# Patient Record
Sex: Male | Born: 1935 | Race: White | Hispanic: No | Smoking: Former smoker
Health system: Southern US, Community
[De-identification: ages and names within clinical notes are randomized; demographics above are authoritative.]

## PROBLEM LIST (undated history)

## (undated) DIAGNOSIS — I219 Acute myocardial infarction, unspecified: Secondary | ICD-10-CM

## (undated) DIAGNOSIS — E039 Hypothyroidism, unspecified: Secondary | ICD-10-CM

## (undated) DIAGNOSIS — D509 Iron deficiency anemia, unspecified: Secondary | ICD-10-CM

## (undated) DIAGNOSIS — N289 Disorder of kidney and ureter, unspecified: Secondary | ICD-10-CM

## (undated) DIAGNOSIS — I779 Disorder of arteries and arterioles, unspecified: Secondary | ICD-10-CM

## (undated) DIAGNOSIS — I2510 Atherosclerotic heart disease of native coronary artery without angina pectoris: Secondary | ICD-10-CM

## (undated) DIAGNOSIS — I739 Peripheral vascular disease, unspecified: Secondary | ICD-10-CM

## (undated) DIAGNOSIS — C4490 Unspecified malignant neoplasm of skin, unspecified: Secondary | ICD-10-CM

## (undated) DIAGNOSIS — H547 Unspecified visual loss: Secondary | ICD-10-CM

## (undated) DIAGNOSIS — I10 Essential (primary) hypertension: Secondary | ICD-10-CM

## (undated) DIAGNOSIS — F0390 Unspecified dementia without behavioral disturbance: Secondary | ICD-10-CM

## (undated) HISTORY — DX: Peripheral vascular disease, unspecified: I73.9

## (undated) HISTORY — DX: Disorder of arteries and arterioles, unspecified: I77.9

## (undated) HISTORY — DX: Unspecified dementia, unspecified severity, without behavioral disturbance, psychotic disturbance, mood disturbance, and anxiety: F03.90

## (undated) HISTORY — DX: Unspecified visual loss: H54.7

## (undated) HISTORY — DX: Hypothyroidism, unspecified: E03.9

## (undated) HISTORY — PX: REPLACEMENT TOTAL KNEE: SUR1224

## (undated) HISTORY — PX: OTHER SURGICAL HISTORY: SHX169

## (undated) HISTORY — PX: VASECTOMY: SHX75

## (undated) HISTORY — DX: Iron deficiency anemia, unspecified: D50.9

## (undated) HISTORY — DX: Unspecified malignant neoplasm of skin, unspecified: C44.90

## (undated) HISTORY — DX: Atherosclerotic heart disease of native coronary artery without angina pectoris: I25.10

---

## 1993-12-26 HISTORY — PX: OTHER SURGICAL HISTORY: SHX169

## 2003-12-27 HISTORY — PX: CORONARY/GRAFT ACUTE MI REVASCULARIZATION: CATH118305

## 2004-12-26 HISTORY — PX: HERNIA REPAIR: SHX51

## 2007-12-27 HISTORY — PX: NASAL SEPTUM SURGERY: SHX37

## 2010-12-26 HISTORY — PX: OTHER SURGICAL HISTORY: SHX169

## 2011-12-27 DIAGNOSIS — I219 Acute myocardial infarction, unspecified: Secondary | ICD-10-CM

## 2011-12-27 HISTORY — DX: Acute myocardial infarction, unspecified: I21.9

## 2017-06-04 ENCOUNTER — Emergency Department (HOSPITAL_COMMUNITY): Payer: Medicare Other

## 2017-06-04 ENCOUNTER — Emergency Department (HOSPITAL_COMMUNITY)
Admission: EM | Admit: 2017-06-04 | Discharge: 2017-06-04 | Disposition: A | Discharge status: Home / Self Care | Payer: Medicare Other | Attending: Emergency Medicine | Admitting: Emergency Medicine

## 2017-06-04 ENCOUNTER — Encounter (HOSPITAL_COMMUNITY): Payer: Self-pay | Admitting: Emergency Medicine

## 2017-06-04 DIAGNOSIS — Z96653 Presence of artificial knee joint, bilateral: Secondary | ICD-10-CM | POA: Diagnosis not present

## 2017-06-04 DIAGNOSIS — I129 Hypertensive chronic kidney disease with stage 1 through stage 4 chronic kidney disease, or unspecified chronic kidney disease: Secondary | ICD-10-CM | POA: Diagnosis not present

## 2017-06-04 DIAGNOSIS — Y92009 Unspecified place in unspecified non-institutional (private) residence as the place of occurrence of the external cause: Secondary | ICD-10-CM | POA: Insufficient documentation

## 2017-06-04 DIAGNOSIS — Z87891 Personal history of nicotine dependence: Secondary | ICD-10-CM | POA: Insufficient documentation

## 2017-06-04 DIAGNOSIS — N183 Chronic kidney disease, stage 3 (moderate): Secondary | ICD-10-CM | POA: Diagnosis not present

## 2017-06-04 DIAGNOSIS — Y999 Unspecified external cause status: Secondary | ICD-10-CM | POA: Diagnosis not present

## 2017-06-04 DIAGNOSIS — W19XXXA Unspecified fall, initial encounter: Secondary | ICD-10-CM

## 2017-06-04 DIAGNOSIS — Y9301 Activity, walking, marching and hiking: Secondary | ICD-10-CM | POA: Insufficient documentation

## 2017-06-04 DIAGNOSIS — W108XXA Fall (on) (from) other stairs and steps, initial encounter: Secondary | ICD-10-CM | POA: Diagnosis not present

## 2017-06-04 DIAGNOSIS — S0990XA Unspecified injury of head, initial encounter: Secondary | ICD-10-CM | POA: Diagnosis present

## 2017-06-04 DIAGNOSIS — S0181XA Laceration without foreign body of other part of head, initial encounter: Secondary | ICD-10-CM | POA: Diagnosis not present

## 2017-06-04 DIAGNOSIS — T148XXA Other injury of unspecified body region, initial encounter: Secondary | ICD-10-CM

## 2017-06-04 HISTORY — DX: Acute myocardial infarction, unspecified: I21.9

## 2017-06-04 HISTORY — DX: Disorder of kidney and ureter, unspecified: N28.9

## 2017-06-04 HISTORY — DX: Essential (primary) hypertension: I10

## 2017-06-04 NOTE — ED Notes (Signed)
Patient transported to CT 

## 2017-06-04 NOTE — ED Triage Notes (Signed)
Per EMS, pt was walking across the parking lot, tried to step on to the curb, tripped and had a mechanical fall forward onto the grass. Pt has abrasions over both hands and knuckles, a laceration to his lip, and skin tear to left face under eye. Unsure of LOC. Pt c/o of bilateral shoulder pain, lip and face pain. No deformities noted. Pt A&O x4. Pt reports he has had on and off blurred/double vision when walking, unknown reason. Pt's left eye stats at 6mm. Pt has history of dementia and takes Aricept. Pt also takes 2 baby ASA/day.

## 2017-06-04 NOTE — ED Notes (Signed)
Patient to X-ray

## 2017-06-04 NOTE — ED Provider Notes (Signed)
[facility] DEPT Provider Note   CSN: [id number] Arrival date & time: 06/04/17  1649     History   Chief Complaint Chief Complaint  Patient presents with  . Fall    HPI Dayan Desa is a 83 y.o. male.  HPI  Patient 83 year old male past medical history of hypertension, CAD, who presents to the emergency department following a mechanical fall. States that he tripped when walking up the steps to get into his house. Landed on his face. Denies loss of consciousness. Complaining of pain to his face, right shoulder, left hand. Denies preceding chest pain, shortness of breath, nausea, vomiting. Endorses intermittent diplopia that has been ongoing for the past couple of months, currently being worked up as an outpatient with ophthalmology. Denies anticoagulation, does take a baby aspirin daily. Denies chest pain, back pain, abdominal pain, nausea, vomiting. Patient has not ambulated following the fall.  Past Medical History:  Diagnosis Date  . Hypertension   . MI (myocardial infarction) ([place]) 2013  . Renal disorder    stage 3 kidney failure    There are no active problems to display for this patient.   Past Surgical History:  Procedure Laterality Date  . REPLACEMENT TOTAL KNEE Left        Home Medications    Prior to Admission medications   Not on File    Family History No family history on file.  Social History Social History  Substance Use Topics  . Smoking status: Former Smoker    Years: 20.00    Types: Cigarettes    Quit date: 1963  . Smokeless tobacco: Never Used  . Alcohol use Yes     Comment: moderate     Allergies   Patient has no allergy information on record.   Review of Systems Review of Systems  Constitutional: Negative for chills and fever.  HENT: Negative for dental problem.   Eyes: Negative for pain and visual disturbance.  Respiratory: Negative for chest tightness and shortness of breath.   Cardiovascular: Negative for chest pain and  palpitations.  Gastrointestinal: Negative for abdominal pain, blood in stool, nausea and vomiting.  Genitourinary: Negative for decreased urine volume, dysuria and flank pain.  Musculoskeletal: Negative for back pain.  Skin: Positive for wound.  Neurological: Negative for dizziness, seizures, syncope, weakness and headaches.  Psychiatric/Behavioral: Negative for behavioral problems.     Physical Exam Updated Vital Signs BP (!) 155/56   Pulse (!) 55   Temp 98.4 F (36.9 C) (Oral)   Resp 18   Ht 5\' 9"  (1.753 m)   Wt 63.5 kg (140 lb)   SpO2 100%   BMI 20.67 kg/m   Physical Exam  Constitutional: He is oriented to person, place, and time. He appears well-developed and well-nourished. No distress.  HENT:  Skin tear to the left inferior eye to the left cheek. Hemostatic. Lower lip with ecchymosis and swelling. 0.5 cm laceration to inner lip. No nasal deviation, no septal hematoma.  Eyes: Conjunctivae and EOM are normal. Pupils are equal, round, and reactive to light.  Sensation intact in V2 distribution.  Neck: Normal range of motion.  No midline cervical, thoracic, lumbar tenderness to palpation.  Cardiovascular: Normal rate, regular rhythm, normal heart sounds and intact distal pulses.   No murmur heard. Pulmonary/Chest: Effort normal and breath sounds normal. No respiratory distress. He exhibits no tenderness.  Abdominal: Soft. He exhibits no distension and no mass. There is no tenderness. There is no guarding.  Musculoskeletal: Normal range of  motion.  No tenderness or immobility to pelvis compression.  Neurological: He is alert and oriented to person, place, and time. He displays normal reflexes. No cranial nerve deficit. Coordination normal.  Skin: Skin is warm.  Psychiatric: He has a normal mood and affect.     ED Treatments / Results  Labs (all labs ordered are listed, but only abnormal results are displayed) Labs Reviewed - No data to display  EKG  EKG  Interpretation None       Radiology Dg Chest 2 View  Result Date: 06/04/2017 CLINICAL DATA:  Fall EXAM: CHEST  2 VIEW COMPARISON:  None. FINDINGS: The heart size and mediastinal contours are within normal limits. Both lungs are clear. The visualized skeletal structures are unremarkable. IMPRESSION: No active cardiopulmonary disease. Electronically Signed   By: Ulyses Jarred M.D.   On: 06/04/2017 18:36   Dg Pelvis 1-2 Views  Result Date: 06/04/2017 CLINICAL DATA:  Generalized right hip pain after a fall. EXAM: PELVIS - 1-2 VIEW COMPARISON:  None. FINDINGS: Femoral heads are located. Sacroiliac joints are symmetric. No acute fracture. Advanced lower lumbar spondylosis. IMPRESSION: No acute osseous abnormality. Electronically Signed   By: Abigail Miyamoto M.D.   On: 06/04/2017 18:37   Dg Shoulder Right  Result Date: 06/04/2017 CLINICAL DATA:  Mechanical fall with generalized right shoulder pain EXAM: RIGHT SHOULDER - 2+ VIEW COMPARISON:  None. FINDINGS: No fracture or dislocation. Mild glenohumeral degenerative changes. Mild AC joint degenerative changes. IMPRESSION: No acute osseous abnormality Electronically Signed   By: Donavan Foil M.D.   On: 06/04/2017 18:38   Ct Head Wo Contrast  Result Date: 06/04/2017 CLINICAL DATA:  Fall with facial bruising EXAM: CT HEAD WITHOUT CONTRAST CT MAXILLOFACIAL WITHOUT CONTRAST CT CERVICAL SPINE WITHOUT CONTRAST TECHNIQUE: Multidetector CT imaging of the head, cervical spine, and maxillofacial structures were performed using the standard protocol without intravenous contrast. Multiplanar CT image reconstructions of the cervical spine and maxillofacial structures were also generated. COMPARISON:  None. FINDINGS: CT HEAD FINDINGS Brain: No mass lesion, intraparenchymal hemorrhage or extra-axial collection. No evidence of acute cortical infarct. There is periventricular hypoattenuation compatible with chronic microvascular disease. There is a single focus of  hyperattenuation along the lateral margin of the right lateral ventricle atrium, favored to be a prominent vein or calcification. Vascular: Atherosclerotic calcification of the vertebral and internal carotid arteries at the skull base. CT MAXILLOFACIAL FINDINGS Osseous: --Complex facial fracture types: No LeFort, zygomaticomaxillary complex or nasoorbitoethmoidal fracture. --Simple fracture types: There is an age indeterminate fracture of the left lamina papyracea. There is no intraorbital gas. --Mandible: No fracture or dislocation. 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 Orbits: The globes appear intact. Normal appearance of the intra- and extraconal fat. Symmetric extraocular muscles. Sinuses: No fluid levels or advanced mucosal thickening. Soft tissues: Mild left periorbital soft tissue swelling. CT CERVICAL SPINE FINDINGS Alignment: No static subluxation. Facets are aligned. Occipital condyles are normally positioned. Skull base and vertebrae: No acute fracture. Soft tissues and spinal canal: No prevertebral fluid or swelling. No visible canal hematoma. Disc levels: Right-greater-than-left uncovertebral hypertrophy and facet arthrosis contribute to moderate to severe neural foraminal narrowing at right C3-4, right C4-5, right C5-6 is and bilateral C6-7. There is severe spinal canal stenosis at the C5-6 level secondary to a large posterior osteophyte. There are flowing anterior osteophytes at C3-C7. Upper chest: No pneumothorax, pulmonary nodule or pleural effusion. Other: Normal visualized paraspinal cervical soft tissues. IMPRESSION: 1. No acute intracranial abnormality. 2. No acute  fracture or static subluxation of the cervical spine. 3. Age-indeterminate fracture of the left lamina papyracea. Given the lack of intraorbital gas, this may be chronic. No other facial fracture. Left periorbital soft tissue swelling. 4. Severe spinal canal stenosis at C5-C6 and multilevel moderate to  severe neural foraminal stenosis, worse on the right. Electronically Signed   By: Ulyses Jarred M.D.   On: 06/04/2017 19:31   Ct Cervical Spine Wo Contrast  Result Date: 06/04/2017 CLINICAL DATA:  Fall with facial bruising EXAM: CT HEAD WITHOUT CONTRAST CT MAXILLOFACIAL WITHOUT CONTRAST CT CERVICAL SPINE WITHOUT CONTRAST TECHNIQUE: Multidetector CT imaging of the head, cervical spine, and maxillofacial structures were performed using the standard protocol without intravenous contrast. Multiplanar CT image reconstructions of the cervical spine and maxillofacial structures were also generated. COMPARISON:  None. FINDINGS: CT HEAD FINDINGS Brain: No mass lesion, intraparenchymal hemorrhage or extra-axial collection. No evidence of acute cortical infarct. There is periventricular hypoattenuation compatible with chronic microvascular disease. There is a single focus of hyperattenuation along the lateral margin of the right lateral ventricle atrium, favored to be a prominent vein or calcification. Vascular: Atherosclerotic calcification of the vertebral and internal carotid arteries at the skull base. CT MAXILLOFACIAL FINDINGS Osseous: --Complex facial fracture types: No LeFort, zygomaticomaxillary complex or nasoorbitoethmoidal fracture. --Simple fracture types: There is an age indeterminate fracture of the left lamina papyracea. There is no intraorbital gas. --Mandible: No fracture or dislocation. 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 Orbits: The globes appear intact. Normal appearance of the intra- and extraconal fat. Symmetric extraocular muscles. Sinuses: No fluid levels or advanced mucosal thickening. Soft tissues: Mild left periorbital soft tissue swelling. CT CERVICAL SPINE FINDINGS Alignment: No static subluxation. Facets are aligned. Occipital condyles are normally positioned. Skull base and vertebrae: No acute fracture. Soft tissues and spinal canal: No prevertebral fluid or  swelling. No visible canal hematoma. Disc levels: Right-greater-than-left uncovertebral hypertrophy and facet arthrosis contribute to moderate to severe neural foraminal narrowing at right C3-4, right C4-5, right C5-6 is and bilateral C6-7. There is severe spinal canal stenosis at the C5-6 level secondary to a large posterior osteophyte. There are flowing anterior osteophytes at C3-C7. Upper chest: No pneumothorax, pulmonary nodule or pleural effusion. Other: Normal visualized paraspinal cervical soft tissues. IMPRESSION: 1. No acute intracranial abnormality. 2. No acute fracture or static subluxation of the cervical spine. 3. Age-indeterminate fracture of the left lamina papyracea. Given the lack of intraorbital gas, this may be chronic. No other facial fracture. Left periorbital soft tissue swelling. 4. Severe spinal canal stenosis at C5-C6 and multilevel moderate to severe neural foraminal stenosis, worse on the right. Electronically Signed   By: Ulyses Jarred M.D.   On: 06/04/2017 19:31   Dg Hand Complete Left  Result Date: 06/04/2017 CLINICAL DATA:  Fall with abrasions over the hands, pain at the left ring finger EXAM: LEFT HAND - COMPLETE 3+ VIEW COMPARISON:  None. FINDINGS: No fracture or malalignment. Narrowing and osteophyte formation at the second through fifth DIP joints and the first through fifth DIP joints. No radiopaque foreign body in the soft tissues. IMPRESSION: 1. No acute osseous abnormality 2. DIP and PIP joint arthritis. Electronically Signed   By: Donavan Foil M.D.   On: 06/04/2017 18:45   Ct Maxillofacial Wo Contrast  Result Date: 06/04/2017 CLINICAL DATA:  Fall with facial bruising EXAM: CT HEAD WITHOUT CONTRAST CT MAXILLOFACIAL WITHOUT CONTRAST CT CERVICAL SPINE WITHOUT CONTRAST TECHNIQUE: Multidetector CT imaging of the head, cervical spine, and  maxillofacial structures were performed using the standard protocol without intravenous contrast. Multiplanar CT image reconstructions of  the cervical spine and maxillofacial structures were also generated. COMPARISON:  None. FINDINGS: CT HEAD FINDINGS Brain: No mass lesion, intraparenchymal hemorrhage or extra-axial collection. No evidence of acute cortical infarct. There is periventricular hypoattenuation compatible with chronic microvascular disease. There is a single focus of hyperattenuation along the lateral margin of the right lateral ventricle atrium, favored to be a prominent vein or calcification. Vascular: Atherosclerotic calcification of the vertebral and internal carotid arteries at the skull base. CT MAXILLOFACIAL FINDINGS Osseous: --Complex facial fracture types: No LeFort, zygomaticomaxillary complex or nasoorbitoethmoidal fracture. --Simple fracture types: There is an age indeterminate fracture of the left lamina papyracea. There is no intraorbital gas. --Mandible: No fracture or dislocation. 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 Orbits: The globes appear intact. Normal appearance of the intra- and extraconal fat. Symmetric extraocular muscles. Sinuses: No fluid levels or advanced mucosal thickening. Soft tissues: Mild left periorbital soft tissue swelling. CT CERVICAL SPINE FINDINGS Alignment: No static subluxation. Facets are aligned. Occipital condyles are normally positioned. Skull base and vertebrae: No acute fracture. Soft tissues and spinal canal: No prevertebral fluid or swelling. No visible canal hematoma. Disc levels: Right-greater-than-left uncovertebral hypertrophy and facet arthrosis contribute to moderate to severe neural foraminal narrowing at right C3-4, right C4-5, right C5-6 is and bilateral C6-7. There is severe spinal canal stenosis at the C5-6 level secondary to a large posterior osteophyte. There are flowing anterior osteophytes at C3-C7. Upper chest: No pneumothorax, pulmonary nodule or pleural effusion. Other: Normal visualized paraspinal cervical soft tissues. IMPRESSION: 1. No  acute intracranial abnormality. 2. No acute fracture or static subluxation of the cervical spine. 3. Age-indeterminate fracture of the left lamina papyracea. Given the lack of intraorbital gas, this may be chronic. No other facial fracture. Left periorbital soft tissue swelling. 4. Severe spinal canal stenosis at C5-C6 and multilevel moderate to severe neural foraminal stenosis, worse on the right. Electronically Signed   By: Ulyses Jarred M.D.   On: 06/04/2017 19:31    Procedures Procedures (including critical care time)  Medications Ordered in ED Medications - No data to display   Initial Impression / Assessment and Plan / ED Course  I have reviewed the triage vital signs and the nursing notes.  Pertinent labs & imaging results that were available during my care of the patient were reviewed by me and considered in my medical decision making (see chart for details).    Patient is a 83 year old male who presents to the emergency Department following a mechanical fall. No recurrent falls. Usually walks with a cane, was not using a cane.  No loss of consciousness. ABCs intact. GCS 15. Nonfocal neurologic exam. Benign abdominal exam. No signs of basilar skull fracture.   CT head, C-spine, x-rays obtained. Showed no acute findings. No signs of entrapment. Patient able to ambulate in the room without any difficulties. Do not feel that further imaging is necessary at this time. Patient's skin tear is unable to have repair. Irrigated and dressing applied at bedside.  Patient stable for discharge home. Patient has follow-up with his primary care physician. Given strict return precautions to the emergency department. Patient and his son expressed understanding, questions or concerns at time of discharge.   Final Clinical Impressions(s) / ED Diagnoses   Final diagnoses:  Fall, initial encounter  Abrasion    New Prescriptions There are no discharge medications for this patient.  Nathaniel Man, MD 06/04/17 1914    Varney Biles, MD 06/07/17 [phone]

## 2017-06-22 ENCOUNTER — Telehealth: Payer: Self-pay | Admitting: Physician Assistant

## 2017-06-22 NOTE — Telephone Encounter (Signed)
Received records from [facility] for appointment on 07/05/17 with Almyra Deforest, PA.  Records put with Hao's schedule for 07/05/17. lp

## 2017-07-05 ENCOUNTER — Ambulatory Visit (INDEPENDENT_AMBULATORY_CARE_PROVIDER_SITE_OTHER): Payer: Medicare Other | Admitting: Physician Assistant

## 2017-07-05 ENCOUNTER — Encounter: Payer: Self-pay | Admitting: Physician Assistant

## 2017-07-05 VITALS — BP 134/56 | HR 54 | Wt 137.6 lb

## 2017-07-05 DIAGNOSIS — N183 Chronic kidney disease, stage 3 unspecified: Secondary | ICD-10-CM

## 2017-07-05 DIAGNOSIS — I739 Peripheral vascular disease, unspecified: Secondary | ICD-10-CM

## 2017-07-05 DIAGNOSIS — I779 Disorder of arteries and arterioles, unspecified: Secondary | ICD-10-CM | POA: Insufficient documentation

## 2017-07-05 DIAGNOSIS — I2510 Atherosclerotic heart disease of native coronary artery without angina pectoris: Secondary | ICD-10-CM | POA: Diagnosis not present

## 2017-07-05 DIAGNOSIS — E039 Hypothyroidism, unspecified: Secondary | ICD-10-CM | POA: Diagnosis not present

## 2017-07-05 DIAGNOSIS — I10 Essential (primary) hypertension: Secondary | ICD-10-CM | POA: Diagnosis not present

## 2017-07-05 NOTE — Progress Notes (Signed)
Cardiology Office Note    Date:  07/05/2017   ID:  Curtis [name], DOB [birth year], MRN [id number]  PCP:  Curtis Hatchet, MD  Cardiologist:  Plan to set up with Curtis [name], case discussed. Previously followed by cardiologist in [facility]  Patient was referred by his PCP Curtis [name] to establish with cardiology for management of history of coronary artery disease.   Chief Complaint  Patient presents with  . New Patient (Initial Visit)    case discussed with Curtis [name], DOD    History of Present Illness:  Curtis [name] is a 83 y.o. male with PMH of hypothyroidism, hypertension, carotid artery disease, CKD stage III, history of CAD status post PCI in 2012. He moved here from [place] to be closer to his son. According to the patient, he had cardiac catheterization with PCI in unknown blood vessel in 2005 and 2012. He does not have a stent card with him today. He has not had another cardiac catheterization since 2012. He has chronic chest pain at rest that is relieved with Tums. However the chest discomfort is different from his usual angina. He does not have any chest discomfort with ambulation. He is accompanied by his wife today who also seek to establish with cardiology service. According to the patient, his primary cardiologist in [place] recently obtain echocardiogram in March 2018. Otherwise he denies any lower extremity edema, orthopnea or paroxysmal nocturnal dyspnea. He has no acute issues today other to establish with cardiology service. Most recently, he presented to the ED on 06/04/2017 following a mechanical fall. According to him, he has double visions, he thought he was stepping on the next stair when he missed and fell to the ground. He hit his left eye and left hand. He did not lose consciousness. He did not have any dizziness prior to the episode. CT of the head and neck was negative for acute process. He was recently established with [facility] as his  primary care. He was referred here for establishment of cardiology service.  I have discussed the case with DOD Curtis [name], we will request his medical record including last 2 office visit, EKG, previous echocardiogram, stress test, last to cardiac catheterization, EKG and lab work from his cardiologist office in [place]. I will decrease his aspirin to 81 mg daily. He is currently not on a statin medication despite history of coronary artery disease. He is primary care provider is obtaining lab work, we'll decide on the statin therapy depending on lab work received. He also has a known history of carotid artery disease, I do not hear any bruit. We'll decide on repeat carotid ultrasound depend on records received from outside facility. He does have occasional calf pain, however it is not exacerbated by exertion, it is actually relieved by exertion. I will hold off on repeating an ABI at this point. Given his advanced age, we'll try to minimize invasive workup.   Past Medical History:  Diagnosis Date  . CAD (coronary artery disease)   . Carotid artery disease ([place])   . Dementia   . Hypertension   . Hypothyroidism   . Iron deficiency anemia   . MI (myocardial infarction) ([place]) 2013  . Renal disorder    stage 3 kidney failure  . Skin cancer   . Vision loss     Past Surgical History:  Procedure Laterality Date  . cataracts  2012, 2013  . choley  1995  . CORONARY/GRAFT ACUTE MI REVASCULARIZATION  2005  .  heart stent  2012  . HERNIA REPAIR  2006  . NASAL SEPTUM SURGERY  2009  . OTHER SURGICAL HISTORY     Fistula (rectal) - 1993  . REPLACEMENT TOTAL KNEE Left   . skin cancer removals    . VASECTOMY      Current Medications: Outpatient Medications Prior to Visit  Medication Sig Dispense Refill  . aspirin 81 MG tablet Take 81 mg by mouth daily.    . Donepezil HCl (ARICEPT PO)     . levothyroxine (SYNTHROID, LEVOTHROID) 112 MCG tablet Take 112 mcg by mouth daily before breakfast.    .  lisinopril (PRINIVIL,ZESTRIL) 5 MG tablet Take 5 mg by mouth daily.    . metoprolol tartrate (LOPRESSOR) 25 MG tablet Take 12.5 mg by mouth daily.     No facility-administered medications prior to visit.      Allergies:   Patient has no known allergies.   Social History   Social History  . Marital status: Unknown    Spouse name: N/A  . Number of children: N/A  . Years of education: N/A   Social History Main Topics  . Smoking status: Former Smoker    Years: 20.00    Types: Cigarettes    Quit date: 1963  . Smokeless tobacco: Never Used  . Alcohol use Yes     Comment: moderate  . Drug use: No  . Sexual activity: Not Asked   Other Topics Concern  . None   Social History Narrative  . None     Family History:  The patient's family history includes Arthritis in his mother; Heart attack in his mother; Hypertension in his mother; Ulcers in his father.   ROS:   Please see the history of present illness.    ROS All other systems reviewed and are negative.   PHYSICAL EXAM:   VS:  BP (!) 134/56   Pulse (!) 54   Wt 137 lb 9.6 oz (62.4 kg)   BMI 20.32 kg/m    GEN: Well nourished, well developed, in no acute distress  HEENT: normal  Neck: no JVD, carotid bruits, or masses Cardiac: RRR; no murmurs, rubs, or gallops,no edema  Respiratory:  clear to auscultation bilaterally, normal work of breathing GI: soft, nontender, nondistended, + BS MS: no deformity or atrophy  Skin: warm and dry, no rash Neuro:  Alert and Oriented x 3, Strength and sensation are intact Psych: euthymic mood, full affect  Wt Readings from Last 3 Encounters:  07/05/17 137 lb 9.6 oz (62.4 kg)  06/04/17 140 lb (63.5 kg)      Studies/Labs Reviewed:   EKG:  EKG is ordered today.  The ekg ordered today demonstrates Normal sinus rhythm with left bundle branch block.  Recent Labs: No results found for requested labs within last 8760 hours.   Lipid Panel No results found for: CHOL, TRIG, HDL, CHOLHDL,  VLDL, LDLCALC, LDLDIRECT  Additional studies/ records that were reviewed today include:   Records from recent ED visit and primary care office reviewed.  CT of head and neck 06/04/2017 IMPRESSION: 1. No acute intracranial abnormality. 2. No acute fracture or static subluxation of the cervical spine. 3. Age-indeterminate fracture of the left lamina papyracea. Given the lack of intraorbital gas, this may be chronic. No other facial fracture. Left periorbital soft tissue swelling. 4. Severe spinal canal stenosis at C5-C6 and multilevel moderate to severe neural foraminal stenosis, worse on the right.   ASSESSMENT:    1. Coronary artery disease  involving native coronary artery of native heart without angina pectoris   2. Carotid artery disease, unspecified laterality ([place])   3. Essential hypertension   4. Hypothyroidism, unspecified type   5. CKD (chronic kidney disease), stage III      PLAN:  In order of problems listed above:  1. CAD with PCI of unknown vessel in 2005 and 2012 in [place]. We will request records from his previous cardiologist. He has recently moved here to join his son. He has no acute issues, despite recent atypical chest pain, his chest pain is not associated with exertion and relieved by Tums. Feels different from his previous angina. We'll hold off on additional workup at this time and will review outside records. We will decrease his aspirin to 81 mg daily. Despite his known history of coronary artery disease, he is currently not on statin medication. We'll have to review the outside records, if no contraindication, likely will start him on at least low-dose statin.  2.  Carotid artery disease: This is by patient report, we have requested carotid ultrasound from primary cardiologist office.  3. Hypertension: Blood pressure well controlled on metoprolol and lisinopril. Unable to up titrate beta blocker due to underlying bradycardia.  4. CKD stage III: lab  work obtained by primary care physician.  5. Hypothyroidism: on Synthroid    Medication Adjustments/Labs and Tests Ordered: Current medicines are reviewed at length with the patient today.  Concerns regarding medicines are outlined above.  Medication changes, Labs and Tests ordered today are listed in the Patient Instructions below. Patient Instructions  Medication Instructions:  Your physician recommends that you continue on your current medications as directed. Please refer to the Current Medication list given to you today.   Labwork: NONE ORDERED TODAY  Testing/Procedures: NONE ORDERED TODAY  Follow-Up: DR. Aaron Edelman CRENSHAW 3 MONTHS   Any Other Special Instructions Will Be Listed Below (If Applicable). YOU HAVE A SIGNED A RELEASE OF INFORMATION SO THAT WE MAY OBTAIN RECORDS FROM [place], [place] DR. Lucita Ferrara    If you need a refill on your cardiac medications before your next appointment, please call your pharmacy.      Hilbert Corrigan, [place]  07/05/2017 4:45 PM    [facility] [address], [place], [place]  [address] Phone: [phone]; Fax: [phone]

## 2017-07-05 NOTE — Patient Instructions (Addendum)
Medication Instructions:  Your physician recommends that you continue on your current medications as directed. Please refer to the Current Medication list given to you today.   Labwork: NONE ORDERED TODAY  Testing/Procedures: NONE ORDERED TODAY  Follow-Up: DR. Aaron Edelman CRENSHAW 3 MONTHS   Any Other Special Instructions Will Be Listed Below (If Applicable). YOU HAVE A SIGNED A RELEASE OF INFORMATION SO THAT WE MAY OBTAIN RECORDS FROM [place], [place] DR. Lucita Ferrara    If you need a refill on your cardiac medications before your next appointment, please call your pharmacy.

## 2017-07-06 ENCOUNTER — Telehealth: Payer: Self-pay | Admitting: Physician Assistant

## 2017-07-06 NOTE — Telephone Encounter (Signed)
Faxed Release signed by patient to Dr Park Breed - [place] to obtain records per [facility] request., Faxed on 07/06/17. lp

## 2017-10-02 ENCOUNTER — Encounter: Payer: Self-pay | Admitting: Cardiology

## 2017-10-02 NOTE — Progress Notes (Signed)
HPI: FU CAD. Pt had PCI in 2005 and 2012 in [place]. Holter 6/14 showed sinus with pacs, pvcs and brief PAT. Nuclear study 6/14 showed EF 60 and normal perfusion. Echo 2/18 showed EF 35-40, mild TR. Also, based on outside DC summary with h/o PAF. Since last seen, patient occasionally has a pain in the right side of his chest relieved with Tums. He does not have exertional chest pain. He denies dyspnea, palpitations, syncope or pedal edema.  Current Outpatient Prescriptions  Medication Sig Dispense Refill  . acetaminophen (TYLENOL) 500 MG tablet Take 500 mg by mouth daily.    Marland Kitchen aspirin 81 MG tablet Take 81 mg by mouth daily.    . carvedilol (COREG) 3.125 MG tablet Take 3.125 mg by mouth 2 (two) times daily with a meal.    . Cholecalciferol (VITAMIN D3) 3000 units TABS Take by mouth daily.    . Donepezil HCl (ARICEPT PO) Take 10 mg by mouth daily.     . Ferrous Sulfate (IRON) 325 (65 Fe) MG TABS Take 3 tablets by mouth daily.     Marland Kitchen levothyroxine (SYNTHROID, LEVOTHROID) 112 MCG tablet Take 112 mcg by mouth daily before breakfast.    . lisinopril (PRINIVIL,ZESTRIL) 5 MG tablet Take 5 mg by mouth daily.    . nitroGLYCERIN (NITROSTAT) 0.3 MG SL tablet Place 0.3 mg under the tongue as needed for chest pain.     No current facility-administered medications for this visit.      Past Medical History:  Diagnosis Date  . CAD (coronary artery disease)   . Carotid artery disease ([place])   . Dementia   . Hypertension   . Hypothyroidism   . Iron deficiency anemia   . MI (myocardial infarction) ([place]) 2013  . Renal disorder    stage 3 kidney failure  . Skin cancer   . Vision loss     Past Surgical History:  Procedure Laterality Date  . cataracts  2012, 2013  . choley  1995  . CORONARY/GRAFT ACUTE MI REVASCULARIZATION  2005  . heart stent  2012  . HERNIA REPAIR  2006  . NASAL SEPTUM SURGERY  2009  . OTHER SURGICAL HISTORY     Fistula (rectal) - 1993  . REPLACEMENT TOTAL KNEE  Left   . skin cancer removals    . VASECTOMY      Social History   Social History  . Marital status: Unknown    Spouse name: N/A  . Number of children: N/A  . Years of education: N/A   Occupational History  . Not on file.   Social History Main Topics  . Smoking status: Former Smoker    Years: 20.00    Types: Cigarettes    Quit date: 1963  . Smokeless tobacco: Never Used  . Alcohol use Yes     Comment: moderate  . Drug use: No  . Sexual activity: Not on file   Other Topics Concern  . Not on file   Social History Narrative  . No narrative on file    Family History  Problem Relation Age of Onset  . Arthritis Mother   . Heart attack Mother   . Hypertension Mother   . Ulcers Father     ROS: no fevers or chills, productive cough, hemoptysis, dysphasia, odynophagia, melena, hematochezia, dysuria, hematuria, rash, seizure activity, orthopnea, PND, pedal edema, claudication. Remaining systems are negative.  Physical Exam: Well-developed frail in no acute distress.  Skin is warm  and dry.  HEENT is normal.  Neck is supple.  Chest is clear to auscultation with normal expansion.  Cardiovascular exam is regular rate and rhythm.  Abdominal exam nontender or distended. No masses palpated. Extremities show no edema. neuro grossly intact   A/P  1 Coronary artery disease-patient is status post PCI in 2005 in 2012 and [place]. Continue aspirin 81 mg daily. I will add pravastatin 40 mg daily. Check lipids and liver in 4 weeks.   2 Carotid artery disease-details unknown. Continue aspirin. Add statin as outlined above. We discussed repeating carotid Dopplers. However he is 9 and I would like to be conservative. I doubt we would subject him to carotid endarterectomy. I would prefer to continue medical therapy and he is in agreement. We will not pursue follow-up carotid Dopplers.  3 hypertension-blood pressure controlled. Continue present medications.  4 history of chronic stage  III kidney disease-labs apparently monitored by primary care.  Kirk Ruths, MD

## 2017-10-12 ENCOUNTER — Ambulatory Visit (INDEPENDENT_AMBULATORY_CARE_PROVIDER_SITE_OTHER): Payer: Medicare Other | Admitting: Cardiology

## 2017-10-12 ENCOUNTER — Encounter: Payer: Self-pay | Admitting: Cardiology

## 2017-10-12 VITALS — BP 100/58 | HR 50 | Ht 69.0 in | Wt 139.0 lb

## 2017-10-12 DIAGNOSIS — I779 Disorder of arteries and arterioles, unspecified: Secondary | ICD-10-CM

## 2017-10-12 DIAGNOSIS — I10 Essential (primary) hypertension: Secondary | ICD-10-CM

## 2017-10-12 DIAGNOSIS — I739 Peripheral vascular disease, unspecified: Secondary | ICD-10-CM

## 2017-10-12 DIAGNOSIS — I2510 Atherosclerotic heart disease of native coronary artery without angina pectoris: Secondary | ICD-10-CM | POA: Diagnosis not present

## 2017-10-12 MED ORDER — PRAVASTATIN SODIUM 40 MG PO TABS: 40.0000 mg | ORAL_TABLET | Freq: Every evening | ORAL | 3 refills | Status: AC

## 2017-10-12 NOTE — Patient Instructions (Signed)
Medication Instructions:   START PRAVASTATIN 40 MG ONCE DAILY  Labwork:  Your physician recommends that you return for lab work in: Advance:  Your physician wants you to follow-up in: [place] will receive a reminder letter in the mail two months in advance. If you don't receive a letter, please call our office to schedule the follow-up appointment.   If you need a refill on your cardiac medications before your next appointment, please call your pharmacy.

## 2017-10-31 ENCOUNTER — Encounter: Payer: Self-pay | Admitting: Neurology

## 2017-10-31 ENCOUNTER — Ambulatory Visit (INDEPENDENT_AMBULATORY_CARE_PROVIDER_SITE_OTHER): Payer: Medicare Other | Admitting: Neurology

## 2017-10-31 VITALS — BP 148/57 | HR 50 | Ht 69.0 in | Wt 144.0 lb

## 2017-10-31 DIAGNOSIS — H532 Diplopia: Secondary | ICD-10-CM | POA: Diagnosis not present

## 2017-10-31 DIAGNOSIS — R413 Other amnesia: Secondary | ICD-10-CM | POA: Diagnosis not present

## 2017-10-31 DIAGNOSIS — N882 Stricture and stenosis of cervix uteri: Secondary | ICD-10-CM | POA: Insufficient documentation

## 2017-10-31 NOTE — Progress Notes (Addendum)
PATIENT: Curtis [name] DOB: January 18, 1922  Chief Complaint  Patient presents with  . Memory Loss    MMSE 29/30 - 14 animals.  He is here with his wife, Curtis [name], to have his declining memory futher evaluated.  Reported history of hydrocephalus.  . Diplopia    He has been experiencing intermittent double vision over the last two years.  . PCP    Damaris Hippo, MD     HISTORICAL  Curtis [name] is a 83 year old male, accompanied by his wife, seen in refer by primary care doctor Damaris Hippo for evaluation of memory loss, diplopia, initial evaluation was on October 31 2017.  I reviewed and summarized the referring note, he has history of hypertension, hypothyroidism, coronary artery disease, chronic kidney disease, he moved from [place] to [place] in early 2015,  He began to notice intermittent double vision since 2016, mostly seeing close up reading newspaper, it took him a while to merge double vision, the change relative positions, some improvement with Prisma, he was seen by ophthalmologist Dr. Valetta Close, per patient, acetylcholine receptor antibodies was negative.  He is a retired Freight forwarder for [employer], in 2015, was noted to have confusion for 4 months, he could not recognize his wife, having hallucinations, thought somebody is living in his house, do not know how to use coffee machine, he was seen by local neurologists, there was a concern for hydrocephalus, had large volume lumbar puncture twice, there was minimum improvement  And then his memory loss, confusion somewhat improved, he was able to go back to driving, he drove to clinic today with his wife using GPS, Still has mild memory loss, he fell in June 2018,  Personally reviewed CT head without contrast in June 2018, generalized atrophy, periventricular microvascular disease,  CT of cervical, degenerative disc disease severe canal stenosis C5-6, moderate to severe bilateral foraminal narrowing   REVIEW OF SYSTEMS: Full  14 system review of systems performed and notable only for weight loss, fatigue, hearing loss, ringing ears, trouble swallowing, double vision, cough, incontinence, impotence, easy bruising, feeling cold, runny nose, memory loss, confusion, dizziness, sleepiness, too much sleep, decreased energy, hallucinations  ALLERGIES: Allergies  Allergen Reactions  . Sulfur Nausea And Vomiting    HOME MEDICATIONS: Current Outpatient Medications  Medication Sig Dispense Refill  . acetaminophen (TYLENOL) 500 MG tablet Take 500 mg by mouth daily.    Marland Kitchen aspirin 81 MG tablet Take 81 mg by mouth daily.    . carvedilol (COREG) 3.125 MG tablet Take 3.125 mg by mouth 2 (two) times daily with a meal.    . Cholecalciferol (VITAMIN D3) 3000 units TABS Take by mouth daily.    . Donepezil HCl (ARICEPT PO) Take 10 mg by mouth daily.     . Ferrous Sulfate (IRON) 325 (65 Fe) MG TABS Take 3 tablets by mouth daily.     Marland Kitchen levothyroxine (SYNTHROID, LEVOTHROID) 112 MCG tablet Take 112 mcg by mouth daily before breakfast.    . nitroGLYCERIN (NITROSTAT) 0.3 MG SL tablet Place 0.3 mg under the tongue as needed for chest pain.    . pravastatin (PRAVACHOL) 40 MG tablet Take 1 tablet (40 mg total) by mouth every evening. 90 tablet 3   No current facility-administered medications for this visit.     PAST MEDICAL HISTORY: Past Medical History:  Diagnosis Date  . CAD (coronary artery disease)   . Carotid artery disease ([place])   . Dementia   . Hypertension   . Hypothyroidism   .  Iron deficiency anemia   . MI (myocardial infarction) ([place]) 2013  . Renal disorder    stage 3 kidney failure  . Skin cancer   . Vision loss     PAST SURGICAL HISTORY: Past Surgical History:  Procedure Laterality Date  . cataracts  2012, 2013  . choley  1995  . CORONARY/GRAFT ACUTE MI REVASCULARIZATION  2005  . heart stent  2012  . HERNIA REPAIR  2006  . NASAL SEPTUM SURGERY  2009  . OTHER SURGICAL HISTORY     Fistula (rectal) - 1993  .  REPLACEMENT TOTAL KNEE Left   . skin cancer removals    . VASECTOMY      FAMILY HISTORY: Family History  Problem Relation Age of Onset  . Arthritis Mother   . Heart attack Mother   . Hypertension Mother   . Ulcers Father     SOCIAL HISTORY:  Social History   Socioeconomic History  . Marital status: Unknown    Spouse name: Not on file  . Number of children: 4  . Years of education: 16  . Highest education level: Bachelor's degree (e.g., BA, AB, BS)  Social Needs  . Financial resource strain: Not on file  . Food insecurity - worry: Not on file  . Food insecurity - inability: Not on file  . Transportation needs - medical: Not on file  . Transportation needs - non-medical: Not on file  Occupational History  . Occupation: Retired  Tobacco Use  . Smoking status: Former Smoker    Years: 20.00    Types: Cigarettes    Last attempt to quit: 1963    Years since quitting: 55.8  . Smokeless tobacco: Never Used  Substance and Sexual Activity  . Alcohol use: Yes    Comment: moderate  . Drug use: No  . Sexual activity: Not on file  Other Topics Concern  . Not on file  Social History Narrative   Lives at home with his wife.   Right-handed.   4-5 cups coffee per day but usually drinks decaf.     PHYSICAL EXAM   Vitals:   10/31/17 1322  BP: (!) 148/57  Pulse: (!) 50  Weight: 144 lb (65.3 kg)  Height: 5\' 9"  (1.753 m)    Not recorded      Body mass index is 21.27 kg/m.  PHYSICAL EXAMNIATION:  Gen: NAD, conversant, well nourised, obese, well groomed                     Cardiovascular: Regular rate rhythm, no peripheral edema, warm, nontender. Eyes: Conjunctivae clear without exudates or hemorrhage Neck: Supple, no carotid bruits. Pulmonary: Clear to auscultation bilaterally   NEUROLOGICAL EXAM:  MMSE - Mini Mental State Exam 10/31/2017  Orientation to time 5  Orientation to Place 5  Registration 3  Attention/ Calculation 5  Recall 2  Language- name 2  objects 2  Language- repeat 1  Language- follow 3 step command 3  Language- read & follow direction 1  Write a sentence 1  Copy design 1  Total score 29  animal naming 14   CRANIAL NERVES: CN II: Visual fields are full to confrontation. Fundoscopic exam is normal with sharp discs and no vascular changes. Pupils are round equal and briskly reactive to light. CN III, IV, VI: extraocular movement are normal. No ptosis. CN V: Facial sensation is intact to pinprick in all 3 divisions bilaterally. Corneal responses are intact.  CN VII: Face is symmetric  with normal eye closure and smile. CN VIII: Hearing is normal to rubbing fingers CN IX, X: Palate elevates symmetrically. Phonation is normal. CN XI: Head turning and shoulder shrug are intact CN XII: Tongue is midline with normal movements and no atrophy.  MOTOR: There is no pronator drift of out-stretched arms. Muscle bulk and tone are normal. Muscle strength is normal.  REFLEXES: Reflexes are 2+ and symmetric at the biceps, triceps, knees, and ankles. Plantar responses are flexor.  SENSORY: Intact to light touch, pinprick, positional sensation and vibratory sensation are intact in fingers and toes.  COORDINATION: Rapid alternating movements and fine finger movements are intact. There is no dysmetria on finger-to-nose and heel-knee-shin.    GAIT/STANCE: He needs to push up to get up from seated position, mildly unsteady  DIAGNOSTIC DATA (LABS, IMAGING, TESTING) - I reviewed patient records, labs, notes, testing and imaging myself where available.   ASSESSMENT AND PLAN  Curtis [name] is a 83 y.o. 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  Lab, include acetylcholine receptor antibody  Get medical record from  [facility] Dr. Valetta Close  And previous neurologist Dr. Fabiola Backer [phone], Fax: [phone]  Marcial Pacas, M.D. Ph.D.  [facility] [address], [address] Phone [phone]  CC: Damaris Hippo, MD Addendum:  Laboratory evaluations in May 2018, negative acetylcholine binding antibody, blocking antibody,  I reviewed ophthalmology report from [facility] [place],  Reviewed neurological evaluation form of [place] [facility] neurological physicians, [facility], Dr. Trecia Rogers  Most recent office visit was on September 09, 2015, concerned about memory loss, MRI of the brain showed diffuse atrophy with mild small vessel disease in October 2015, record, he had neuropsychiatric evaluations, stated normal aging but possible depressed mood/hallucinations/delusions, that is related to underlying subtle mood issues, suggested psychiatric evaluation,  There was previous workup of possible normal pressure hydrocephalus, no significant improvement upon 2 trials with large volume lumbar puncture, gait analysis, was seen by neurosurgeon, VP shunt was not recommended, continue Aricept,  There were issues with computer pornography, social stress,  Fluoroscopy guided lumbar puncture with gait analysis in January 2016, removal of 40 cc of clear CSF fluid, MRI of the brain in October 2015, diffuse atrophy, mild small vessel disease,  Laboratory evaluation from [facility] October 2018, decreased TSH 0.07, potassium 5.8, GFR 40, LDL 96, cholesterol 163, hemoglobin 10.6, MCV 100.7, elevated,

## 2017-11-02 ENCOUNTER — Telehealth: Payer: Self-pay | Admitting: *Deleted

## 2017-11-02 NOTE — Telephone Encounter (Signed)
Pt release form faxed to Dr Dimas Millin # [phone] fax [phone], Dr Inda Castle [place] fax [phone].

## 2017-11-09 LAB — LIPID PANEL
Chol/HDL Ratio: 2.6 ratio (ref 0.0–5.0)
Cholesterol, Total: 162 mg/dL (ref 100–199)
HDL: 62 mg/dL (ref >39)
LDL CALC: 88 mg/dL (ref 0–99)
Triglycerides: 58 mg/dL (ref 0–149)
VLDL CHOLESTEROL CAL: 12 mg/dL (ref 5–40)

## 2017-11-09 LAB — HEPATIC FUNCTION PANEL
ALBUMIN: 3.7 g/dL (ref 3.2–4.6)
ALK PHOS: 83 IU/L (ref 39–117)
ALT: 11 IU/L (ref 0–44)
AST: 19 IU/L (ref 0–40)
Bilirubin Total: 0.3 mg/dL (ref 0.0–1.2)
Bilirubin, Direct: 0.11 mg/dL (ref 0.00–0.40)
TOTAL PROTEIN: 6 g/dL (ref 6.0–8.5)

## 2017-11-10 ENCOUNTER — Encounter: Payer: Self-pay | Admitting: *Deleted

## 2017-11-18 ENCOUNTER — Ambulatory Visit
Admission: RE | Admit: 2017-11-18 | Discharge: 2017-11-18 | Disposition: A | Discharge status: Home / Self Care | Payer: Medicare Other | Source: Ambulatory Visit | Attending: Neurology | Admitting: Neurology

## 2017-11-18 DIAGNOSIS — R413 Other amnesia: Secondary | ICD-10-CM

## 2017-11-18 DIAGNOSIS — N882 Stricture and stenosis of cervix uteri: Secondary | ICD-10-CM

## 2017-11-18 DIAGNOSIS — M4802 Spinal stenosis, cervical region: Secondary | ICD-10-CM | POA: Diagnosis not present

## 2017-11-18 DIAGNOSIS — H532 Diplopia: Secondary | ICD-10-CM

## 2017-11-20 ENCOUNTER — Telehealth: Payer: Self-pay | Admitting: Neurology

## 2017-11-20 NOTE — Telephone Encounter (Signed)
Spoke to patient's wife on HIPAA - she is aware of results and verbalized understanding.  They will keep his pending appt for further discussion.

## 2017-11-20 NOTE — Telephone Encounter (Signed)
Please call patient, MRI of the brain showed age-related changes, no acute abnormality, MRI of the cervical spine showed multilevel degenerative changes, variable degree of canal stenosis, most severe at C5-6, with moderate canal stenosis, moderate degree at C3-4, C4-5, there is no evidence of cervical nerve root compression    IMPRESSION:  This MRI of the brain without contrast shows the following: 1.    Moderate generalized cortical atrophy that is more pronounced in the mesial temporal lobes bilaterally. 2.    Chronic microvascular ischemic changes in both hemispheres, right greater than left. One focus in the right occipital lobe also involves gray matter and could represent a small chronic stroke. 3.    Mild chronic right maxillary and ethmoid sinusitis. 4.    There are no acute findings.    IMPRESSION:  This MRI of the cervical spine shows multilevel degenerative changes as detailed above. The most significant findings are: 1.    At C3-C4, there is a large right disc osteophyte complex and facet hypertrophy causing moderately severe right foraminal narrowing and mild spinal stenosis. There is potential for right C4 nerve root compression.  2.    At C4-C5, there are degenerative changes causing mild spinal stenosis and moderate bilateral foraminal narrowing but no nerve root compression. 3.    At C5-C6, there is moderately severe spinal stenosis and moderate left greater than right foraminal narrowing but no definite nerve root compression. 4.    At C6-C7, there is mild spinal stenosis and moderate bilateral foraminal narrowing but no nerve root compression.   The appears to be be degenerative fusion at this level. 5.    At C7-T1 there is 2 mm of anterolisthesis and degenerative changes causing borderline spinal stenosis and moderate left foraminal narrowing but no nerve root compression.

## 2017-12-14 ENCOUNTER — Telehealth: Payer: Self-pay | Admitting: Neurology

## 2017-12-14 ENCOUNTER — Other Ambulatory Visit: Payer: Self-pay | Admitting: *Deleted

## 2017-12-14 MED ORDER — DONEPEZIL HCL 10 MG PO TABS: 10.0000 mg | ORAL_TABLET | Freq: Every day | ORAL | 11 refills | Status: AC

## 2017-12-14 NOTE — Telephone Encounter (Signed)
Per last note, continue Aricept - confirmed 10mg  daily dose.  Rx sent to requested pharmacy.

## 2017-12-14 NOTE — Telephone Encounter (Signed)
Patient's wife requesting a new Rx for Donepezil HCl (ARICEPT PO) called to [facility] @ [facility].

## 2017-12-27 ENCOUNTER — Encounter: Payer: Self-pay | Admitting: Podiatry

## 2017-12-27 ENCOUNTER — Ambulatory Visit (INDEPENDENT_AMBULATORY_CARE_PROVIDER_SITE_OTHER): Payer: Medicare Other | Admitting: Podiatry

## 2017-12-27 VITALS — BP 173/79 | HR 52

## 2017-12-27 DIAGNOSIS — M79674 Pain in right toe(s): Secondary | ICD-10-CM | POA: Diagnosis not present

## 2017-12-27 DIAGNOSIS — B351 Tinea unguium: Secondary | ICD-10-CM

## 2017-12-27 DIAGNOSIS — M79675 Pain in left toe(s): Secondary | ICD-10-CM | POA: Diagnosis not present

## 2017-12-27 NOTE — Progress Notes (Signed)
   Subjective:    Patient ID: Curtis [name], male    DOB: 11/18/1922, 84 y.o.   MRN: [id number]  HPI  This patient presents to this office with chief complaint of long thick painful nails.  He says the nails are painful walking and wearing his shoes.  He says he is unable to self treat.  He says he has moved down to [place] from [place] where he was regularly see a podiatrist every 3 months.  He says it h 9 months since she moved  to [place].  He presents the office today for an evaluation and treatment of his nails.    Review of Systems  All other systems reviewed and are negative.      Objective:   Physical Exam General Appearance  Alert, conversant and in no acute stress.  Vascular  Dorsalis pedis and posterior pulses are palpable  bilaterally.  Capillary return is within normal limits  bilaterally. Temperature is within normal limits  Bilaterally.  Neurologic  Senn-Weinstein monofilament wire test within normal limits  bilaterally. Muscle power within normal limits bilaterally.  Nails Thick disfigured discolored nails with subungual debris bilaterally from hallux to fifth toes bilaterally. No evidence of bacterial infection or drainage bilaterally.  Orthopedic  No limitations of motion of motion feet bilaterally.  No crepitus or effusions noted.  No bony pathology or digital deformities noted.  Skin  normotropic skin with no porokeratosis noted bilaterally.  No signs of infections or ulcers noted.         Assessment & Plan:  Onychomycosis  B/L  IE  Debridement of nails x 10.  RTC 3 months.   Gardiner Barefoot DPM

## 2018-01-04 ENCOUNTER — Emergency Department (HOSPITAL_COMMUNITY): Payer: Medicare Other

## 2018-01-04 ENCOUNTER — Ambulatory Visit (INDEPENDENT_AMBULATORY_CARE_PROVIDER_SITE_OTHER): Payer: Medicare Other | Admitting: Neurology

## 2018-01-04 ENCOUNTER — Emergency Department (HOSPITAL_COMMUNITY)
Admission: EM | Admit: 2018-01-04 | Discharge: 2018-01-26 | Disposition: E | Payer: Medicare Other | Attending: Emergency Medicine | Admitting: Emergency Medicine

## 2018-01-04 ENCOUNTER — Encounter: Payer: Self-pay | Admitting: Neurology

## 2018-01-04 ENCOUNTER — Other Ambulatory Visit: Payer: Self-pay

## 2018-01-04 VITALS — BP 159/72 | HR 47 | Wt 147.0 lb

## 2018-01-04 DIAGNOSIS — R269 Unspecified abnormalities of gait and mobility: Secondary | ICD-10-CM | POA: Diagnosis not present

## 2018-01-04 DIAGNOSIS — Y999 Unspecified external cause status: Secondary | ICD-10-CM | POA: Insufficient documentation

## 2018-01-04 DIAGNOSIS — Y939 Activity, unspecified: Secondary | ICD-10-CM | POA: Diagnosis not present

## 2018-01-04 DIAGNOSIS — I629 Nontraumatic intracranial hemorrhage, unspecified: Secondary | ICD-10-CM | POA: Diagnosis not present

## 2018-01-04 DIAGNOSIS — Z7982 Long term (current) use of aspirin: Secondary | ICD-10-CM | POA: Diagnosis not present

## 2018-01-04 DIAGNOSIS — I619 Nontraumatic intracerebral hemorrhage, unspecified: Secondary | ICD-10-CM | POA: Diagnosis not present

## 2018-01-04 DIAGNOSIS — S0990XA Unspecified injury of head, initial encounter: Secondary | ICD-10-CM | POA: Diagnosis present

## 2018-01-04 DIAGNOSIS — R413 Other amnesia: Secondary | ICD-10-CM

## 2018-01-04 DIAGNOSIS — S06339A Contusion and laceration of cerebrum, unspecified, with loss of consciousness of unspecified duration, initial encounter: Secondary | ICD-10-CM | POA: Insufficient documentation

## 2018-01-04 DIAGNOSIS — Y929 Unspecified place or not applicable: Secondary | ICD-10-CM | POA: Diagnosis not present

## 2018-01-04 DIAGNOSIS — Z79899 Other long term (current) drug therapy: Secondary | ICD-10-CM | POA: Diagnosis not present

## 2018-01-04 DIAGNOSIS — S0291XA Unspecified fracture of skull, initial encounter for closed fracture: Secondary | ICD-10-CM | POA: Diagnosis not present

## 2018-01-04 DIAGNOSIS — W19XXXA Unspecified fall, initial encounter: Secondary | ICD-10-CM | POA: Diagnosis not present

## 2018-01-04 LAB — CBC WITH DIFFERENTIAL/PLATELET
BASOS PCT: 0 %
Basophils Absolute: 0 10*3/uL (ref 0.0–0.1)
EOS ABS: 0.4 10*3/uL (ref 0.0–0.7)
EOS PCT: 5 %
HCT: 36.6 % — ABNORMAL LOW (ref 39.0–52.0)
Hemoglobin: 12 g/dL — ABNORMAL LOW (ref 13.0–17.0)
LYMPHS ABS: 2.5 10*3/uL (ref 0.7–4.0)
Lymphocytes Relative: 32 %
MCH: 32.9 pg (ref 26.0–34.0)
MCHC: 32.8 g/dL (ref 30.0–36.0)
MCV: 100.3 fL — ABNORMAL HIGH (ref 78.0–100.0)
MONOS PCT: 9 %
Monocytes Absolute: 0.7 10*3/uL (ref 0.1–1.0)
Neutro Abs: 4.3 10*3/uL (ref 1.7–7.7)
Neutrophils Relative %: 54 %
PLATELETS: 195 10*3/uL (ref 150–400)
RBC: 3.65 MIL/uL — ABNORMAL LOW (ref 4.22–5.81)
RDW: 13.6 % (ref 11.5–15.5)
WBC: 7.9 10*3/uL (ref 4.0–10.5)

## 2018-01-04 LAB — COMPREHENSIVE METABOLIC PANEL
ALK PHOS: 60 U/L (ref 38–126)
ALT: 14 U/L — ABNORMAL LOW (ref 17–63)
AST: 29 U/L (ref 15–41)
Albumin: 3.4 g/dL — ABNORMAL LOW (ref 3.5–5.0)
Anion gap: 8 (ref 5–15)
BUN: 31 mg/dL — AB (ref 6–20)
CALCIUM: 8.8 mg/dL — AB (ref 8.9–10.3)
CHLORIDE: 110 mmol/L (ref 101–111)
CO2: 22 mmol/L (ref 22–32)
CREATININE: 1.47 mg/dL — AB (ref 0.61–1.24)
GFR calc Af Amer: 46 mL/min — ABNORMAL LOW (ref >60)
GFR, EST NON AFRICAN AMERICAN: 39 mL/min — AB (ref >60)
Glucose, Bld: 83 mg/dL (ref 65–99)
Potassium: 4.7 mmol/L (ref 3.5–5.1)
SODIUM: 140 mmol/L (ref 135–145)
Total Bilirubin: 0.8 mg/dL (ref 0.3–1.2)
Total Protein: 6.4 g/dL — ABNORMAL LOW (ref 6.5–8.1)

## 2018-01-04 LAB — I-STAT CHEM 8, ED
BUN: 30 mg/dL — AB (ref 6–20)
CALCIUM ION: 1.18 mmol/L (ref 1.15–1.40)
CHLORIDE: 109 mmol/L (ref 101–111)
Creatinine, Ser: 1.4 mg/dL — ABNORMAL HIGH (ref 0.61–1.24)
GLUCOSE: 90 mg/dL (ref 65–99)
HCT: 36 % — ABNORMAL LOW (ref 39.0–52.0)
Hemoglobin: 12.2 g/dL — ABNORMAL LOW (ref 13.0–17.0)
POTASSIUM: 4.7 mmol/L (ref 3.5–5.1)
Sodium: 142 mmol/L (ref 135–145)
TCO2: 22 mmol/L (ref 22–32)

## 2018-01-04 LAB — PROTIME-INR
INR: 1.05
Prothrombin Time: 13.6 seconds (ref 11.4–15.2)

## 2018-01-04 LAB — I-STAT ARTERIAL BLOOD GAS, ED
ACID-BASE DEFICIT: 5 mmol/L — AB (ref 0.0–2.0)
Bicarbonate: 20.7 mmol/L (ref 20.0–28.0)
O2 SAT: 100 %
PCO2 ART: 40.4 mmHg (ref 32.0–48.0)
PH ART: 7.318 — AB (ref 7.350–7.450)
Patient temperature: 98.6
TCO2: 22 mmol/L (ref 22–32)
pO2, Arterial: 475 mmHg — ABNORMAL HIGH (ref 83.0–108.0)

## 2018-01-04 LAB — TRIGLYCERIDES: TRIGLYCERIDES: 163 mg/dL — AB (ref <150)

## 2018-01-04 LAB — APTT: aPTT: 34 seconds (ref 24–36)

## 2018-01-04 MED ORDER — MORPHINE SULFATE (PF) 4 MG/ML IV SOLN
4.0000 mg | Freq: Once | INTRAVENOUS | Status: AC
  Administered 2018-01-04: 4 mg via INTRAVENOUS
  Filled 2018-01-04: qty 1

## 2018-01-04 MED ORDER — NICARDIPINE HCL IN NACL 20-0.86 MG/200ML-% IV SOLN: 3.0000 mg/h | Freq: Once | INTRAVENOUS | Status: DC

## 2018-01-04 MED ORDER — SODIUM CHLORIDE 0.9 % IV SOLN
5.0000 mg/h | INTRAVENOUS | Status: DC
  Administered 2018-01-04: 5 mg/h via INTRAVENOUS
  Filled 2018-01-04 (×2): qty 10

## 2018-01-04 MED ORDER — DIPHENHYDRAMINE HCL 50 MG/ML IJ SOLN: 12.5000 mg | Freq: Four times a day (QID) | INTRAMUSCULAR | Status: DC | PRN

## 2018-01-04 MED ORDER — IOPAMIDOL (ISOVUE-370) INJECTION 76%
INTRAVENOUS | Status: AC
  Administered 2018-01-04: 50 mL
  Filled 2018-01-04: qty 50

## 2018-01-04 MED ORDER — MEMANTINE HCL 10 MG PO TABS: 10.0000 mg | ORAL_TABLET | Freq: Two times a day (BID) | ORAL | 11 refills | Status: AC

## 2018-01-04 MED ORDER — LORAZEPAM 2 MG/ML IJ SOLN
1.0000 mg | Freq: Once | INTRAMUSCULAR | Status: AC
  Administered 2018-01-04: 1 mg via INTRAVENOUS
  Filled 2018-01-04: qty 1

## 2018-01-04 MED ORDER — DIPHENHYDRAMINE HCL 12.5 MG/5ML PO ELIX: 12.5000 mg | ORAL_SOLUTION | Freq: Four times a day (QID) | ORAL | Status: DC | PRN

## 2018-01-04 MED ORDER — ONDANSETRON HCL 4 MG/2ML IJ SOLN: 4.0000 mg | Freq: Four times a day (QID) | INTRAMUSCULAR | Status: DC | PRN

## 2018-01-04 MED ORDER — LABETALOL HCL 5 MG/ML IV SOLN: 20.0000 mg | INTRAVENOUS | Status: DC | PRN

## 2018-01-04 MED ORDER — IOPAMIDOL (ISOVUE-370) INJECTION 76%
INTRAVENOUS | Status: AC
  Filled 2018-01-04: qty 50

## 2018-01-04 MED ORDER — MORPHINE SULFATE (PF) 4 MG/ML IV SOLN
8.0000 mg | Freq: Once | INTRAVENOUS | Status: AC
  Administered 2018-01-04: 8 mg via INTRAVENOUS
  Filled 2018-01-04: qty 2

## 2018-01-04 MED ORDER — LABETALOL HCL 5 MG/ML IV SOLN
10.0000 mg | Freq: Once | INTRAVENOUS | Status: AC
  Administered 2018-01-04: 10 mg via INTRAVENOUS
  Filled 2018-01-04: qty 4

## 2018-01-04 MED ORDER — PROPOFOL 1000 MG/100ML IV EMUL: 0.0000 ug/kg/min | INTRAVENOUS | Status: DC

## 2018-01-04 NOTE — Procedures (Signed)
Extubation Procedure Note  Patient Details:   Name: Curtis [name] DOB: 20-Oct-1922 MRN: [id number]   Airway Documentation:     Evaluation  O2 sats: currently acceptable Complications: No apparent complications Patient did tolerate procedure well. Bilateral Breath Sounds: Clear, Diminished   No   Patient was terminally extubated per ED physician.  Carrington Clamp A 01/17/2018, 10:24 PM

## 2018-01-04 NOTE — ED Notes (Signed)
Patient transported from CT to room with RN

## 2018-01-04 NOTE — ED Notes (Signed)
Patient transported to CT 

## 2018-01-04 NOTE — Procedures (Signed)
Intubation Procedure Note Curtis [name] [id number] 03/22/1922  Procedure: Intubation Indications: Airway protection and maintenance  Procedure Details Consent: Risks of procedure as well as the alternatives and risks of each were explained to the (patient/caregiver).  Consent for procedure obtained. Time Out: Verified patient identification, verified procedure, site/side was marked, verified correct patient position, special equipment/implants available, medications/allergies/relevent history reviewed, required imaging and test results available.  Performed  Maximum sterile technique was used including gloves, gown and hand hygiene.  MAC and 3    Evaluation Hemodynamic Status: BP stable throughout; O2 sats: stable throughout Patient's Current Condition: stable Complications: No apparent complications Patient did tolerate procedure well. Chest X-ray ordered to verify placement.  CXR: tube position acceptable.  Patient intubated by ED physician_0    Curtis [name] 12/27/2017

## 2018-01-04 NOTE — ED Triage Notes (Signed)
Per EMS patient fell unattended and was found AAOx1 by EMS. Per EMS patient had right side pupil of 5 and left side pupil of 2. Patient complains of headache upon arrival.

## 2018-01-04 NOTE — ED Notes (Signed)
Back from CT with RN.

## 2018-01-04 NOTE — Progress Notes (Signed)
I have reviewed the patient's head CT angiogram.  There is no obvious aneurysm.   However there has been progressive bleeding in his posterior fossa, left cerebellum and perhaps into his brainstem.  There is  little chance for survival and no chance for meaningful recovery in this 84 year old patient.  Under the circumstances I would recommend comfort care and consideration of withdrawal of support after the patient's son arrives.  I have discussed the situation with Dr. Kathrynn Humble.

## 2018-01-04 NOTE — Progress Notes (Signed)
PATIENT: Curtis [name] DOB: [birth year]  Chief Complaint  Patient presents with  . Follow-up  . PCP    Dr. Damaris [name]     HISTORICAL  Curtis [name] a 84 year old male, accompanied by his wife, seen in refer by primary care doctor Curtis [name] for evaluation of memory loss, diplopia, initial evaluation was on October 31 2017. They lives at [facility].  I reviewed and summarized the referring note, he has history of hypertension, hypothyroidism, coronary artery disease, chronic kidney disease, he moved from [place] to [place] in early 2015,  He began to notice intermittent double vision since 2016, mostly seeing close up reading newspaper, it took him a while to merge double vision, the change relative positions, some improvement with Prisma, he was seen by ophthalmologist Dr. Valetta Close, per patient, acetylcholine receptor antibodies was negative.  He [name] a retired Freight forwarder for [employer], in 2015, was noted to have confusion for 4 months, he could not recognize his wife, having hallucinations, thought somebody [name] living in his house, do not know how to use coffee machine, he was seen by local neurologists, there was a concern for hydrocephalus, had large volume lumbar puncture twice, there was minimum improvement  And then his memory loss, confusion somewhat improved, he was able to go back to driving, he drove to clinic today with his wife using GPS, Still has mild memory loss, he fell in June 2018,  Personally reviewed CT head without contrast in June 2018, generalized atrophy, periventricular microvascular disease,  CT of cervical, degenerative disc disease severe canal stenosis C5-6, moderate to severe bilateral foraminal narrowing  UPDATE Jan 04 2018:    Laboratory evaluations in May 2018, negative acetylcholine binding antibody, blocking antibody,   Reviewed neurological evaluation form of [place] [facility], [facility], Dr. Trecia Rogers  Most recent office visit was on September 09, 2015, concerned about memory loss, MRI of the brain showed diffuse atrophy with mild small vessel disease in October 2015, record, he had neuropsychiatric evaluations, stated normal aging but possible depressed mood/hallucinations/delusions, that [name] related to underlying subtle mood issues, suggested psychiatric evaluation,  There was previous workup of possible normal pressure hydrocephalus, no significant improvement upon 2 trials with large volume lumbar puncture, gait analysis, was seen by neurosurgeon, VP shunt was not recommended, continue Aricept,  There were issues with computer pornography, social stress,  Fluoroscopy guided lumbar puncture with gait analysis in January 2016, removal of 40 cc of clear CSF fluid, MRI of the brain in October 2015, diffuse atrophy, mild small vessel disease,  Laboratory evaluation from [facility] October 2018, decreased TSH 0.07, potassium 5.8, GFR 40, LDL 96, cholesterol 163, hemoglobin 10.6, MCV 100.7, elevated,    MRI of the brain without contrast in Nov 2018:  Moderate generalized cortical atrophy that [name] more pronounced in the mesial temporal lobes bilaterally.  Chronic microvascular ischemic changes in both hemispheres, right greater than left. One focus in the right occipital lobe also involves gray matter and could represent a small chronic stroke.   MRI of the cervical spine shows multilevel degenerative changes, moderate right foraminal narrowing C3-4,  moderate bilateral foraminal narrowing at C4-5, severe spinal stenosis at C 5 6,  He complains of thigh and calf muscle pain, occasional right side neck pain, he has more difficulty reading. He has good mood.  REVIEW OF SYSTEMS: Full 14 system review of systems performed and notable only for double vision, loss of vision, hearing loss,  trouble swallowing, incontinence of bladder or testicular pain, joint pain, achy muscles,  walking difficulty neck pain, confusion, memory loss chills,  ALLERGIES: Allergies  Allergen Reactions  . Sulfur Nausea And Vomiting    HOME MEDICATIONS: Current Outpatient Medications  Medication Sig Dispense Refill  . acetaminophen (TYLENOL) 500 MG tablet Take 1,000 mg by mouth 2 (two) times daily.     Marland Kitchen aspirin 81 MG tablet Take 81 mg by mouth daily.    . carvedilol (COREG) 3.125 MG tablet Take 3.125 mg by mouth 2 (two) times daily with a meal.    . Cholecalciferol (VITAMIN D3) 3000 units TABS Take by mouth daily.    Marland Kitchen donepezil (ARICEPT) 10 MG tablet Take 1 tablet (10 mg total) by mouth daily. 30 tablet 11  . Ferrous Sulfate (IRON) 325 (65 Fe) MG TABS Take 3 tablets by mouth daily.     Marland Kitchen levothyroxine (SYNTHROID, LEVOTHROID) 75 MCG tablet Take 75 mcg by mouth daily before breakfast.    . nitroGLYCERIN (NITROSTAT) 0.3 MG SL tablet Place 0.3 mg under the tongue as needed for chest pain.    . pravastatin (PRAVACHOL) 40 MG tablet Take 1 tablet (40 mg total) by mouth every evening. 90 tablet 3   No current facility-administered medications for this visit.     PAST MEDICAL HISTORY: Past Medical History:  Diagnosis Date  . CAD (coronary artery disease)   . Carotid artery disease ([place])   . Dementia   . Hypertension   . Hypothyroidism   . Iron deficiency anemia   . MI (myocardial infarction) ([place]) 2013  . Renal disorder    stage 3 kidney failure  . Skin cancer   . Vision loss     PAST SURGICAL HISTORY: Past Surgical History:  Procedure Laterality Date  . cataracts  2012, 2013  . choley  1995  . CORONARY/GRAFT ACUTE MI REVASCULARIZATION  2005  . heart stent  2012  . HERNIA REPAIR  2006  . NASAL SEPTUM SURGERY  2009  . OTHER SURGICAL HISTORY     Fistula (rectal) - 1993  . REPLACEMENT TOTAL KNEE Left   . skin cancer removals    . VASECTOMY      FAMILY HISTORY: Family History  Problem Relation Age of Onset  . Arthritis Mother   . Heart attack Mother   .  Hypertension Mother   . Ulcers Father     SOCIAL HISTORY:  Social History   Socioeconomic History  . Marital status: Unknown    Spouse name: Not on file  . Number of children: 4  . Years of education: 16  . Highest education level: Bachelor's degree (e.g., BA, AB, BS)  Social Needs  . Financial resource strain: Not on file  . Food insecurity - worry: Not on file  . Food insecurity - inability: Not on file  . Transportation needs - medical: Not on file  . Transportation needs - non-medical: Not on file  Occupational History  . Occupation: Retired  Tobacco Use  . Smoking status: Former Smoker    Years: 20.00    Types: Cigarettes    Last attempt to quit: 1963    Years since quitting: 56.0  . Smokeless tobacco: Never Used  Substance and Sexual Activity  . Alcohol use: Yes    Comment: moderate  . Drug use: No  . Sexual activity: Not on file  Other Topics Concern  . Not on file  Social History Narrative   Lives at home with his  wife.   Right-handed.   4-5 cups coffee per day but usually drinks decaf.     PHYSICAL EXAM   Vitals:   01/03/2018 1102  BP: (!) 159/72  Pulse: (!) 47  Weight: 147 lb (66.7 kg)    Not recorded      Body mass index [name] 21.71 kg/m.  PHYSICAL EXAMNIATION:  Gen: NAD, conversant, well nourised, obese, well groomed                     Cardiovascular: Regular rate rhythm, no peripheral edema, warm, nontender. Eyes: Conjunctivae clear without exudates or hemorrhage Neck: Supple, no carotid bruits. Pulmonary: Clear to auscultation bilaterally   NEUROLOGICAL EXAM:  MMSE - Mini Mental State Exam 10/31/2017  Orientation to time 5  Orientation to Place 5  Registration 3  Attention/ Calculation 5  Recall 2  Language- name 2 objects 2  Language- repeat 1  Language- follow 3 step command 3  Language- read & follow direction 1  Write a sentence 1  Copy design 1  Total score 29  animal naming 14   CRANIAL NERVES: CN II: Visual fields are  full to confrontation. Fundoscopic exam [name] normal with sharp discs and no vascular changes. Pupils are round equal and briskly reactive to light. CN III, IV, VI: extraocular movement are normal. No ptosis. CN V: Facial sensation [name] intact to pinprick in all 3 divisions bilaterally. Corneal responses are intact.  CN VII: Face [name] symmetric with normal eye closure and smile. CN VIII: Hearing [name] normal to rubbing fingers CN IX, X: Palate elevates symmetrically. Phonation [name] normal. CN XI: Head turning and shoulder shrug are intact CN XII: Tongue [name] midline with normal movements and no atrophy.  MOTOR: There [name] no pronator drift of out-stretched arms. Muscle bulk and tone are normal. Muscle strength [name] normal.  REFLEXES: Reflexes are 2+ and symmetric at the biceps, triceps, knees, and ankles. Plantar responses are flexor.  SENSORY: Intact to light touch, pinprick, positional sensation and vibratory sensation are intact in fingers and toes.  COORDINATION: Rapid alternating movements and fine finger movements are intact. There [name] no dysmetria on finger-to-nose and heel-knee-shin.    GAIT/STANCE: He needs to push up to get up from seated position, mildly unsteady  DIAGNOSTIC DATA (LABS, IMAGING, TESTING) - I reviewed patient records, labs, notes, testing and imaging myself where available.   ASSESSMENT AND PLAN  Elisha Cooksey [name] a 84 y.o. 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  He has been taking Aricept, complains of GI side effect, will switch him over to Namenda 10 mg twice a day  Encouraged him moderate  exercise  Marcial Pacas, M.D. Ph.D.  [facility] [address] Phone [phone]  CC: Curtis Hippo, MD Addendum:  Laboratory evaluations in May 2018, negative acetylcholine binding antibody, blocking antibody,  I reviewed ophthalmology report from [facility] [place],  Reviewed neurological evaluation form of [place] [facility], [facility], Dr. Trecia Rogers  Most recent office visit was on September 09, 2015, concerned about memory loss, MRI of the brain showed diffuse atrophy with mild small vessel disease in October 2015, record, he had neuropsychiatric  evaluations, stated normal aging but possible depressed mood/hallucinations/delusions, that [name] related to underlying subtle mood issues, suggested psychiatric evaluation,  There was previous workup of possible normal pressure hydrocephalus, no significant improvement upon 2 trials with large volume lumbar puncture, gait analysis, was seen by neurosurgeon, VP shunt was not recommended, continue Aricept,  There were issues with computer pornography, social stress,  Fluoroscopy guided lumbar puncture with gait analysis in January 2016, removal of 40 cc of clear CSF fluid, MRI of the brain in October 2015, diffuse atrophy, mild small vessel disease,  Laboratory evaluation from [facility] October 2018, decreased TSH 0.07, potassium 5.8, GFR 40, LDL 96, cholesterol 163, hemoglobin 10.6, MCV 100.7, elevated,

## 2018-01-04 NOTE — Consult Note (Signed)
Reason for Consult: Skull fracture, subarachnoid hemorrhage, intraventricular hemorrhage Referring Physician: Dr. Nigel Berthold [name] is an 84 y.o. male.  HPI: The patient is a 84 year old white male who by report was doing fairly well today.  He saw Dr. Krista [name] follow-up regarding dementia versus normal pressure hydrocephalus.  The patient was getting his wife's walker out of the car when he either had a syncopal event and fell or fell.  The patient was brought to [facility] via EMS and evaluated by Dr. Kathrynn [name] including a head and neck CT.  The head CT demonstrated the patient had a significant skull fracture, large subarachnoid hemorrhage in the posterior fossa and basal cisterns, fourth ventricular intraventricular hemorrhage, and a small left frontal hemorrhage.  A neurosurgical consultation was requested.  Presently the patient is accompanied by his wife and his daughter-in-law.  His wife had just gone inside prior to the patient's event today so she did not witness the fall.  Prior to my arrival the patient was complaining of a severe headache.  He is noncommunicative now.  No past medical history on file.    No family history on file.  Social History:  has no tobacco, alcohol, and drug history on file.  Allergies: Allergies not on file  Medications:  I have reviewed the patient's current medications. Prior to Admission:  (Not in a hospital admission) Scheduled:  Continuous:  VPX:[id number] Anti-infectives (From admission, onward)   None       Results for orders placed or performed during the hospital encounter of 01/03/2018 (from the past 48 hour(s))  I-stat chem 8, ed     Status: Abnormal   Collection Time: 01/20/2018  5:26 PM  Result Value Ref Range   Sodium 142 135 - 145 mmol/L   Potassium 4.7 3.5 - 5.1 mmol/L   Chloride 109 101 - 111 mmol/L   BUN 30 (H) 6 - 20 mg/dL   Creatinine, Ser 1.40 (H) 0.61 - 1.24 mg/dL   Glucose, Bld 90 65 - 99 mg/dL   Calcium,  Ion 1.18 1.15 - 1.40 mmol/L   TCO2 22 22 - 32 mmol/L   Hemoglobin 12.2 (L) 13.0 - 17.0 g/dL   HCT 36.0 (L) 39.0 - 52.0 %    Ct Head Wo Contrast  Result Date: 01/20/2018 CLINICAL DATA:  84 y/o  M; found down.  Possible fall.  Head pain. EXAM: CT HEAD WITHOUT CONTRAST TECHNIQUE: Contiguous axial images were obtained from the base of the skull through the vertex without intravenous contrast. COMPARISON:  None. FINDINGS: Brain: 3 mm subdural hematoma over left parietal convexity. Thin subdural hematoma left aspect of posterior falx and left tentorium cerebelli. Moderate volume of subarachnoid hemorrhage throughout basilar cisterns, prepontine cistern, within cerebellar folia, and several small foci over the convexities. Small volume of hemorrhage within fourth ventricle and left occipital horn of lateral ventricle, likely redistribution. Hypoattenuation with hemorrhage of left lateral cerebellar hemisphere, probable cortical contusion. Mass effect with 4 mm left-to-right midline shift. Hemorrhage and mass effect in the posterior fossa effaces cisterns, fourth ventricle, and results in slight upward herniation of the midbrain. No downward herniation at this time. Lateral and third ventricles are moderately enlarged, no transependymal interstitial edema at this time. Vascular: No unexpected calcification. Skull: Comminuted fracture of the left parietal bone with up to 3 mm of depression. Fracture lines cross the sagittal suture to right parietal bone. Large soft tissue contusion over the left parietal scalp and additional small contusion over the right paramedian occipital  scalp. Possible widening of the left temporal occipital joint may reflect diastatic fracture. Sinuses/Orbits: No acute finding. Other: None. IMPRESSION: 1. Comminuted left parietal bone fracture with up to 3 mm of depression. Fracture lines propagating into the right parietal bone. 2. Possible diastatic fracture of left temporal-occipital  syndesmosis. 3. 3 mm subdural hematoma over left parietal convexity, left posterior falx, and left tentorium cerebelli. 4. Moderate volume subarachnoid hemorrhage predominantly around basilar cisterns and posterior fossa. 5. Hemorrhage in fourth ventricle and trace hemorrhage in left atria. 6. Left lateral cerebellar hemisphere probable cortical contusion. 7. Mass effect with 4 mm left-to-right midline shift. Complete effacement of posterior fossa extra-axial spaces and slight upward herniation of midbrain. No downward herniation at this time. 8. Moderate lateral and third ventriculomegaly, no prior for comparison, patient is at high risk for hydrocephalus and follow-up is recommended. These results were called by telephone at the time of interpretation on 12/31/2017 at 5:48 pm to Dr. Varney [name] , who verbally acknowledged these results. Electronically Signed   By: Curtis [name] M.D.   On: 01/03/2018 17:55   Ct Cervical Spine Wo Contrast  Result Date: 12/30/2017 CLINICAL DATA:  Found down.  Possible fall. EXAM: CT CERVICAL SPINE WITHOUT CONTRAST TECHNIQUE: Multidetector CT imaging of the cervical spine was performed without intravenous contrast. Multiplanar CT image reconstructions were also generated. COMPARISON:  None. FINDINGS: Alignment: Normal. Skull base and vertebrae: No evidence for an acute fracture. Extensive facet osteoarthritis is noted bilaterally, right greater than left. Soft tissues and spinal canal: Soft tissues of the neck are unremarkable. There is prominent posterior bony spurring at C5-6 and C6-7 generating moderate to severe mass-effect in the central canal at the C5-6 level, likely with a component of cord compression. Disc levels: Near complete loss of disc height is seen at C5-6 and C6-7. Upper chest: Unremarkable. Other: Hemorrhage identified in the lower brain is better characterized on the head CT that was performed at the same time and reported separately. IMPRESSION:  1. No acute fracture in the cervical spine. 2. 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 The disease at C5-6 narrows the central canal and a moderate to severe degree and likely generate some degree of cord compression. 3. Intracranial acute hemorrhage identified in the lower brain imaged as part of this exam. This is better characterized on the dedicated head CT performed at the same time and reported separately. Electronically Signed   By: Misty Stanley M.D.   On: 12/26/2017 17:42    ROS: Unobtainable Blood pressure (!) 188/86, pulse (!) 38, resp. rate (!) 22, height 5\' 9"  (1.753 m), weight 66.7 kg (147 lb), SpO2 94 %. Physical Exam  General: A 84 year old comatose white male in a cervical collar on the stretcher.  HEENT: The patient has some left posterior scalp swelling.  His pupils are 2 mm on the left and 3 mm on the right.  He reacts to light on the right.  Neck: Supple.  He is wearing a hard collar.  Thorax: Symmetric  Abdomen: Soft  Extremities: Unremarkable  Neurologic exam: The patient is Glasgow Coma Scale 8, [id number] his pupils are as above.  He will localize to pain with his left upper extremity.  He does not move his right or lower extremities.  He has spontaneous respirations.  I have reviewed the patient's head CT performed at [facility] today.  The patient has a parietal and occipital  skull fracture.  He has a small left frontal extra-axial hemorrhage without significant mass-effect.  He has a large amount of arachnoid blood in the posterior fossa and basal cisterns.  He has a intraventricular hemorrhage.  The patient has ventriculomegaly but has a history of normal pressure hydrocephalus.  I have also reviewed the patient's cervical CT performed at [facility] today.  The patient has degenerative changes.  He has spondylosis most prominent at C5-6.  He has a mild degenerative  subluxation at C7-T1.  Assessment/Plan: Subarachnoid hemorrhage, skull fracture: I have discussed the situation with the patient's wife and daughter-in-law.  While it is possible that these hemorrhages traumatic, the amount of blood in the posterior fossa and basal cisterns is concerning for a ruptured posterior fossa aneurysm.  Either way, the family understands the patient's prognosis is poor given his age and decreased neurologic status.  The patient's neurologic status is declining.  They informed me he does have a living will but he would not be opposed to short-term life support.  We discussed whether or not to intubate him.  They have elected intubation and supportive care for now.  We will plan to work him up further with a CT angiogram and if it demonstrates an aneurysm then perhaps we will treat this if his neurologic status improves.  If his neurologic status does not improve they are considering withdrawal of support.  Ventriculomegaly: Patient has a history of large ventricles and a tentative diagnosis of normal pressure hydrocephalus.  I do not think he needs a ventriculostomy presently.  Bradycardia, medical problems: The patient is gravely ill.  I would suggest having the patient admitted to the intensivist service.    Ophelia Charter 01/22/2018, 7:00 PM

## 2018-01-04 NOTE — ED Provider Notes (Signed)
[place] [facility] EMERGENCY DEPARTMENT Provider Note   CSN: [id number] Arrival date & time: 01/24/2018  1706     History   Chief Complaint Chief Complaint  Patient presents with  . Fall    HPI Curtis [name] is a 84 y.o. male.  HPI Patient comes in with chief complaint of fall.  Level 5 caveat for altered mental status.  84 year old male comes in after a fall.  Patient does not have a lot of medical problems, and is normally coherent and active.  According to EMS patient had a fall outside his car, they are not sure if there was any loss of consciousness, however patient has been confused and repeatedly asking the same questions.  Patient is also complaining of headache.  No seizure-like activity appreciated.  Patient is normally alert/oriented x3.  At this time patient is oriented to self.  He is complaining of headache but denies any other symptoms.   No past medical history on file.  There are no active problems to display for this patient.     Home Medications    Prior to Admission medications   Medication Sig Start Date End Date Taking? Authorizing Provider  acetaminophen (TYLENOL) 500 MG tablet Take 1,000 mg by mouth 2 (two) times daily.   Yes [provider]  aspirin EC 81 MG tablet Take 81 mg by mouth daily.   Yes [provider]  carvedilol (COREG) 3.125 MG tablet Take 3.125 mg by mouth 2 (two) times daily with a meal.   Yes [provider]  ferrous sulfate 325 (65 FE) MG tablet Take 975 mg by mouth daily with breakfast.   Yes [provider]  levothyroxine (SYNTHROID, LEVOTHROID) 75 MCG tablet Take 75 mcg by mouth daily before breakfast.   Yes [provider]  pravastatin (PRAVACHOL) 40 MG tablet Take 40 mg by mouth daily.   Yes [provider]  Vitamin D, Ergocalciferol, 2000 units CAPS Take 2,000 Units by mouth daily.   Yes [provider]  donepezil (ARICEPT) 10 MG tablet Take 10 mg by  mouth at bedtime.    [provider]    Family History No family history on file.  Social History Social History   Tobacco Use  . Smoking status: Not on file  Substance Use Topics  . Alcohol use: Not on file  . Drug use: Not on file     Allergies   Sulfa antibiotics   Review of Systems Review of Systems  Unable to perform ROS: Mental status change     Physical Exam Updated Vital Signs BP (!) 138/50   Pulse (!) 52   Temp 98.3 F (36.8 C) (Oral)   Resp 10   Ht 5\' 9"  (1.753 m)   Wt 66.7 kg (147 lb)   SpO2 97%   BMI 21.71 kg/m   Physical Exam  Constitutional: He is oriented to person, place, and time. He appears well-developed.  HENT:  Head: Normocephalic and atraumatic.  Eyes: Conjunctivae and EOM are normal. Pupils are equal, round, and reactive to light.  Neck:  In c -collar  Cardiovascular: Normal rate and regular rhythm.  Pulmonary/Chest: Effort normal and breath sounds normal.  Abdominal: Soft. Bowel sounds are normal. He exhibits no distension and no mass. There is no tenderness. There is no rebound and no guarding.  Musculoskeletal: He exhibits no deformity.  Neurological: He is alert and oriented to person, place, and time.  Skin: Skin is warm.  Nursing note and  vitals reviewed.    ED Treatments / Results  Labs (all labs ordered are listed, but only abnormal results are displayed) Labs Reviewed  TRIGLYCERIDES - Abnormal; Notable for the following components:      Result Value   Triglycerides 163 (*)    All other components within normal limits  COMPREHENSIVE METABOLIC PANEL - Abnormal; Notable for the following components:   BUN 31 (*)    Creatinine, Ser 1.47 (*)    Calcium 8.8 (*)    Total Protein 6.4 (*)    Albumin 3.4 (*)    ALT 14 (*)    GFR calc non Af Amer 39 (*)    GFR calc Af Amer 46 (*)    All other components within normal limits  CBC WITH DIFFERENTIAL/PLATELET - Abnormal; Notable for the following components:   RBC  3.65 (*)    Hemoglobin 12.0 (*)    HCT 36.6 (*)    MCV 100.3 (*)    All other components within normal limits  I-STAT CHEM 8, ED - Abnormal; Notable for the following components:   BUN 30 (*)    Creatinine, Ser 1.40 (*)    Hemoglobin 12.2 (*)    HCT 36.0 (*)    All other components within normal limits  I-STAT ARTERIAL BLOOD GAS, ED - Abnormal; Notable for the following components:   pH, Arterial 7.318 (*)    pO2, Arterial 475.0 (*)    Acid-base deficit 5.0 (*)    All other components within normal limits  PROTIME-INR  APTT  CBC WITH DIFFERENTIAL/PLATELET  HEPATIC FUNCTION PANEL  PROTIME-INR  APTT    EKG  EKG Interpretation None       Radiology Ct Angio Head W Or Wo Contrast  Result Date: 01/08/2018 CLINICAL DATA:  84 y/o M; subarachnoid hemorrhage, evaluation for cerebral aneurysm for cerebral vasospasm. EXAM: CT ANGIOGRAPHY HEAD TECHNIQUE: Multidetector CT imaging of the head was performed using the standard protocol during bolus administration of intravenous contrast. Multiplanar CT image reconstructions and MIPs were obtained to evaluate the vascular anatomy. CONTRAST:  50 cc Isovue 370 COMPARISON:  01/04/2017 CT head FINDINGS: CTA HEAD Anterior circulation: Extensive calcified plaque of carotid siphons with mild less than 50% bilateral paraclinoid stenosis. Left M1 short segment of mild-to-moderate stenosis. No aneurysm or high flow vascular lesion identified. Posterior circulation: Right V4 calcified plaque with 50-70% moderate to severe stenosis. Left V4 calcified plaque mild less than 50% stenosis. No aneurysm or high flow vascular lesion identified. Venous sinuses: As permitted by contrast timing, patent. Anatomic variants: Large left A1, small right A1, anterior communicating artery, likely normal variant. Left dominant ACA system. Delayed phase: Marked interval increase in size of hematoma within the left cerebellar hemisphere with progressive upward transtentorial  herniation, left-to-right midline shift in posterior fossa, and compression of the brainstem. The hematoma measures approximately 4.1 x 6.5 x 2.0 cm (volume = 28 cm^3). On the delayed phase there is curvilinear enhancement within the hematoma which may represent active hemorrhage. IMPRESSION: 1. No aneurysm or high flow vascular lesion identified. 2. Interval progression of large hematoma within the left cerebellar hemisphere measuring approximately 28 cc. Streak of enhancement on delayed phase may represent active hemorrhage. 3. Increased posterior fossa mass effect with progressive superior transtentorial herniation, left-to-right shift of posterior fossa, and brainstem compression. 4. Patent anterior and posterior intracranial circulation. Multiple areas of stenosis with calcified plaque compatible with atherosclerotic disease. No large vessel occlusion. These results were called by telephone at the time  of interpretation on 01/03/2018 at 8:27 pm to Dr. Kathrynn Humble, who verbally acknowledged these results. Electronically Signed   By: Kristine Garbe M.D.   On: 12/26/2017 20:29   Ct Head Wo Contrast  Result Date: 12/30/2017 CLINICAL DATA:  84 y/o  M; found down.  Possible fall.  Head pain. EXAM: CT HEAD WITHOUT CONTRAST TECHNIQUE: Contiguous axial images were obtained from the base of the skull through the vertex without intravenous contrast. COMPARISON:  None. FINDINGS: Brain: 3 mm subdural hematoma over left parietal convexity. Thin subdural hematoma left aspect of posterior falx and left tentorium cerebelli. Moderate volume of subarachnoid hemorrhage throughout basilar cisterns, prepontine cistern, within cerebellar folia, and several small foci over the convexities. Small volume of hemorrhage within fourth ventricle and left occipital horn of lateral ventricle, likely redistribution. Hypoattenuation with hemorrhage of left lateral cerebellar hemisphere, probable cortical contusion. Mass effect with 4  mm left-to-right midline shift. Hemorrhage and mass effect in the posterior fossa effaces cisterns, fourth ventricle, and results in slight upward herniation of the midbrain. No downward herniation at this time. Lateral and third ventricles are moderately enlarged, no transependymal interstitial edema at this time. Vascular: No unexpected calcification. Skull: Comminuted fracture of the left parietal bone with up to 3 mm of depression. Fracture lines cross the sagittal suture to right parietal bone. Large soft tissue contusion over the left parietal scalp and additional small contusion over the right paramedian occipital scalp. Possible widening of the left temporal occipital joint may reflect diastatic fracture. Sinuses/Orbits: No acute finding. Other: None. IMPRESSION: 1. Comminuted left parietal bone fracture with up to 3 mm of depression. Fracture lines propagating into the right parietal bone. 2. Possible diastatic fracture of left temporal-occipital syndesmosis. 3. 3 mm subdural hematoma over left parietal convexity, left posterior falx, and left tentorium cerebelli. 4. Moderate volume subarachnoid hemorrhage predominantly around basilar cisterns and posterior fossa. 5. Hemorrhage in fourth ventricle and trace hemorrhage in left atria. 6. Left lateral cerebellar hemisphere probable cortical contusion. 7. Mass effect with 4 mm left-to-right midline shift. Complete effacement of posterior fossa extra-axial spaces and slight upward herniation of midbrain. No downward herniation at this time. 8. Moderate lateral and third ventriculomegaly, no prior for comparison, patient is at high risk for hydrocephalus and follow-up is recommended. These results were called by telephone at the time of interpretation on 01/24/2018 at 5:48 pm to Dr. Varney Biles , who verbally acknowledged these results. Electronically Signed   By: Kristine Garbe M.D.   On: 01/15/2018 17:55   Ct Cervical Spine Wo Contrast  Result  Date: 01/02/2018 CLINICAL DATA:  Found down.  Possible fall. EXAM: CT CERVICAL SPINE WITHOUT CONTRAST TECHNIQUE: Multidetector CT imaging of the cervical spine was performed without intravenous contrast. Multiplanar CT image reconstructions were also generated. COMPARISON:  None. FINDINGS: Alignment: Normal. Skull base and vertebrae: No evidence for an acute fracture. Extensive facet osteoarthritis is noted bilaterally, right greater than left. Soft tissues and spinal canal: Soft tissues of the neck are unremarkable. There is prominent posterior bony spurring at C5-6 and C6-7 generating moderate to severe mass-effect in the central canal at the C5-6 level, likely with a component of cord compression. Disc levels: Near complete loss of disc height is seen at C5-6 and C6-7. Upper chest: Unremarkable. Other: Hemorrhage identified in the lower brain is better characterized on the head CT that was performed at the same time and reported separately. IMPRESSION: 1. No acute fracture in the cervical spine. 2. 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 The disease at C5-6 narrows the central canal and a moderate to severe degree and likely generate some degree of cord compression. 3. Intracranial acute hemorrhage identified in the lower brain imaged as part of this exam. This is better characterized on the dedicated head CT performed at the same time and reported separately. Electronically Signed   By: Misty Stanley M.D.   On: 01/15/2018 17:42   Dg Chest Port 1 View  Result Date: 01/24/2018 CLINICAL DATA:  Endotracheal tube placement. EXAM: PORTABLE CHEST 1 VIEW COMPARISON:  None. FINDINGS: Endotracheal tube has tip 3.4 cm above the carina. Nasogastric tube is coiled over the stomach as tip is not definitely visualized. Lungs are adequately inflated without focal airspace consolidation or effusion. Minimal hazy prominence of the  perihilar markings likely mild vascular congestion. Suggestion of a couple small calcified granulomas over the right lung and perihilar region. Cardiomediastinal silhouette and remainder of the exam is unremarkable. IMPRESSION: Suggestion of minimal vascular congestion. Tubes and lines as described. Electronically Signed   By: Marin Olp M.D.   On: 01/24/2018 19:44    Procedures Procedures (including critical care time)  CRITICAL CARE Performed by: Ansar Skoda   Total critical care time: 128 minutes  Critical care time was exclusive of separately billable procedures and treating other patients.  Critical care was necessary to treat or prevent imminent or life-threatening deterioration.  Critical care was time spent personally by me on the following activities: development of treatment plan with patient and/or surrogate as well as nursing, discussions with consultants, evaluation of patient's response to treatment, examination of patient, obtaining history from patient or surrogate, ordering and performing treatments and interventions, ordering and review of laboratory studies, ordering and review of radiographic studies, pulse oximetry and re-evaluation of patient's condition.   Medications Ordered in ED Medications  labetalol (NORMODYNE,TRANDATE) injection 20 mg (not administered)  propofol (DIPRIVAN) 1000 MG/100ML infusion (not administered)  iopamidol (ISOVUE-370) 76 % injection (not administered)  nicardipine (CARDENE) 20mg  in 0.86% saline 224ml IV infusion (0.1 mg/ml) (3 mg/hr Intravenous Not Given 01/17/2018 2218)  ondansetron (ZOFRAN) injection 4 mg (not administered)  diphenhydrAMINE (BENADRYL) injection 12.5 mg (not administered)    Or  diphenhydrAMINE (BENADRYL) 12.5 MG/5ML elixir 12.5 mg (not administered)  morphine 250 mg in sodium chloride 0.9 % 250 mL (1 mg/mL) infusion (0 mg/hr Intravenous Stopped 01-12-2018 0030)  labetalol (NORMODYNE,TRANDATE) injection 10 mg (10 mg  Intravenous Given 01/19/2018 1806)  iopamidol (ISOVUE-370) 76 % injection (50 mLs  Contrast Given 12/28/2017 1949)  morphine 4 MG/ML injection 8 mg (8 mg Intravenous Given 01/08/2018 2218)  LORazepam (ATIVAN) injection 1 mg (1 mg Intravenous Given 01/02/2018 2342)  morphine 4 MG/ML injection 4 mg (4 mg Intravenous Given 12/31/2017 2326)     Initial Impression / Assessment and Plan / ED Course  I have reviewed the triage vital signs and the nursing notes.  Pertinent labs & imaging results that were available during my care of the patient were reviewed by me and considered in my medical decision making (see chart for details).    Patient comes in with chief complaint of fall. Patient has altered mental status and is complaining of headaches. Clinical concern for brain bleed.  Patient is 8, however he is an active 84 year old who is not on any blood thinners.  Patient does take baby aspirin.  Family reports that patient is full code.  They would  want neurosurgical intervention, if there is reversible cause.  CT scan of the head ordered, and patient is noted to have skull fracture and subdural + subarachnoid bleed. I spoke with Dr. Arnoldo Morale, neurosurgery, who informed me that the prognosis is most likely very poor.  However given the type of bleeding seen on the CT scan, he is wondering if patient has a brain aneurysm.  He requested we do a CT angiogram after talking to the family.  Patient required intubation for the CT scan to be completed.  CT angiogram does not show any acute aneurysm.  The brain bleed however has progressed over time, and there is worsening mass-effect.  It appears that the skull fracture, has allowed for the pressure to not rise dramatically, still we have started noticing that patient is having autonomic instability.  Results from the ER workup, and prognosis discussed with the family.  They agreed to de-escalating care once there was no aneurysm appreciated, compassionate extubation done  in the ED, the patient passed away at 12:08 AM with loved ones at the bedside.   Final Clinical Impressions(s) / ED Diagnoses   Final diagnoses:  Brain bleed ([place])  Intracranial hemorrhage ([place])  Closed skull fracture with cerebral contusion, initial encounter [facility])    ED Discharge Orders    None       Varney Biles, MD 06-Jan-2018 [phone]

## 2018-01-04 NOTE — ED Notes (Addendum)
Patient's heartrate stabilized in the 70's after intubation from low 30's-50's previous rates recorded.

## 2018-01-04 NOTE — ED Notes (Signed)
20mg  etomidate given followed by 63 rocuronium

## 2018-01-04 NOTE — Progress Notes (Signed)
Patient transported to CT and back to D33 on ventilator with no complications.

## 2018-01-04 NOTE — ED Provider Notes (Signed)
INTUBATION Performed by: Ferdinand Cava  Required items: required blood products, implants, devices, and special equipment available Patient identity confirmed: provided demographic data and hospital-assigned identification number Time out: Immediately prior to procedure a "time out" was called to verify the correct patient, procedure, equipment, support staff and site/side marked as required.  Indications: airway protection  Intubation method: Glidescope  Preoxygenation: [place]  Sedatives: Etomidate Paralytic: Rocuronium  Tube Size: 7.5 cuffed  Post-procedure assessment: chest rise and ETCO2 monitor Breath sounds: equal and absent over the epigastrium Tube secured with: ETT holder Chest x-ray interpreted by radiologist and me.  Chest x-ray findings: endotracheal tube in appropriate position  Patient tolerated the procedure well with no immediate complications.      Jenny Reichmann, MD 01/03/2018 2038    Varney Biles, MD 01-12-2018 1275

## 2018-01-04 NOTE — ED Notes (Signed)
ED Provider at bedside. 

## 2018-01-04 NOTE — ED Notes (Signed)
Out to CT with RN

## 2018-01-04 NOTE — ED Notes (Signed)
No trauma surgeon arrived.

## 2018-01-05 ENCOUNTER — Encounter: Payer: Self-pay | Admitting: Neurology

## 2018-01-05 DIAGNOSIS — I619 Nontraumatic intracerebral hemorrhage, unspecified: Secondary | ICD-10-CM | POA: Diagnosis not present

## 2018-01-05 MED ORDER — PROPOFOL 1000 MG/100ML IV EMUL
INTRAVENOUS | Status: AC | PRN
  Administered 2018-01-04: 10 ug/kg/min via INTRAVENOUS

## 2018-01-26 NOTE — ED Notes (Signed)
[employer] contacted. 

## 2018-01-26 NOTE — ED Notes (Signed)
MD at bedside. Confirmed death at 37.

## 2018-01-26 NOTE — ED Notes (Signed)
Pt wedding ring was removed from Pt and turned into security due to family leaving.

## 2018-01-26 NOTE — ED Notes (Signed)
Took wedding ring to pt body in mourge with [place], security and [place], Therapist, sports.

## 2018-01-26 DEATH — deceased

## 2018-03-28 ENCOUNTER — Ambulatory Visit: Payer: Medicare Other | Admitting: Podiatry

## 2018-07-05 ENCOUNTER — Ambulatory Visit: Payer: Medicare Other | Admitting: Nurse Practitioner

## 2018-08-28 IMAGING — CT CT HEAD W/O CM
4 series · 15 of 47 positions shown, 17 images · non-contrast
Comparison: None.

CLINICAL DATA: [AGE]/o  M; found down.  Possible fall.  Head pain.

EXAM:
CT HEAD WITHOUT CONTRAST
TECHNIQUE: Contiguous axial images were obtained from the base of the skull
through the vertex without intravenous contrast.

[Series 3: head wo · axial · 0.46mm/px · z∈[+1292,+1412]mm · 7 of 33 slices shown, 9 images]
[im 5/33  brain]
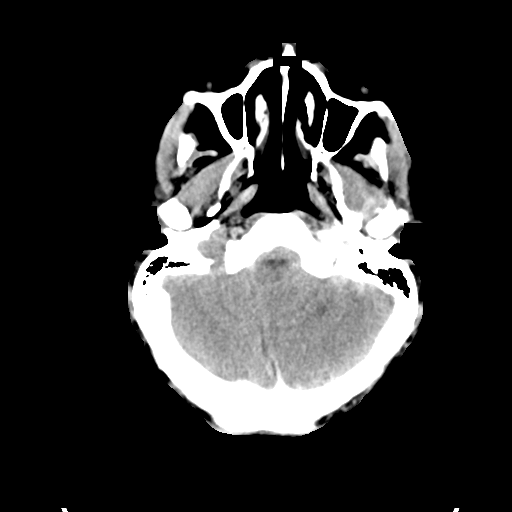
[im 5/33  bone]
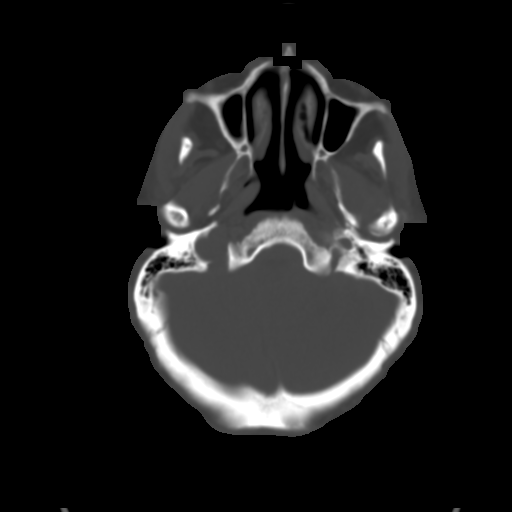
[im 9/33  brain]
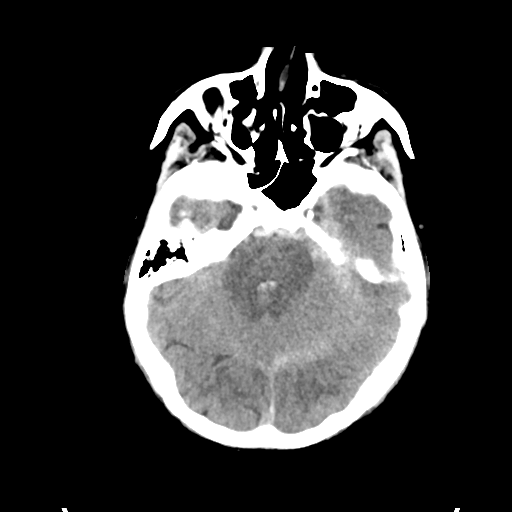
[im 13/33  brain]
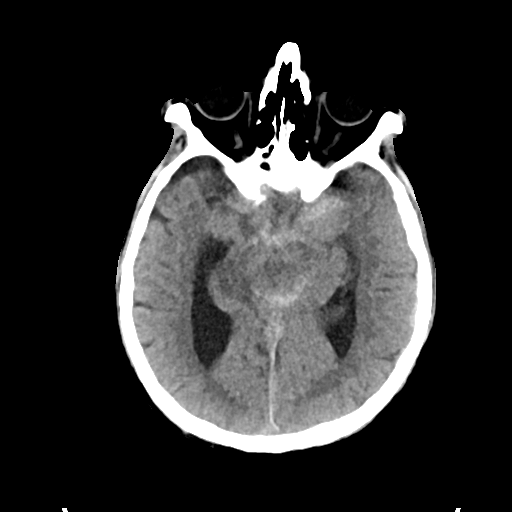
[im 17/33  brain]
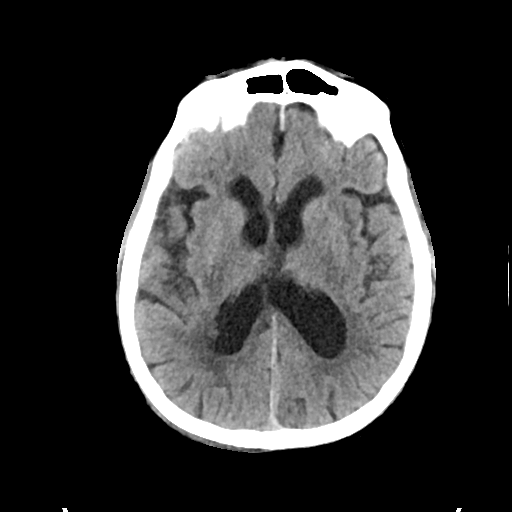
[im 21/33  brain]
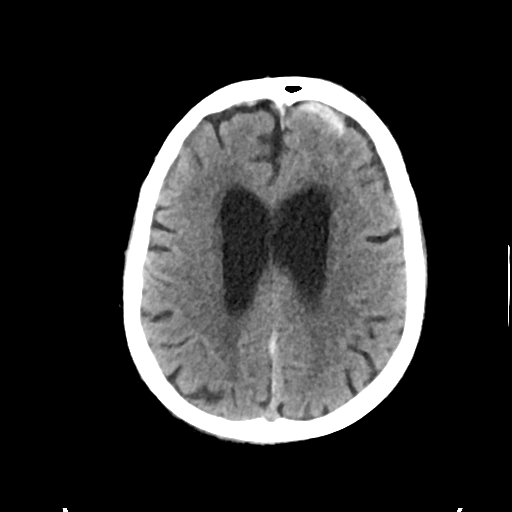
[im 21/33  bone]
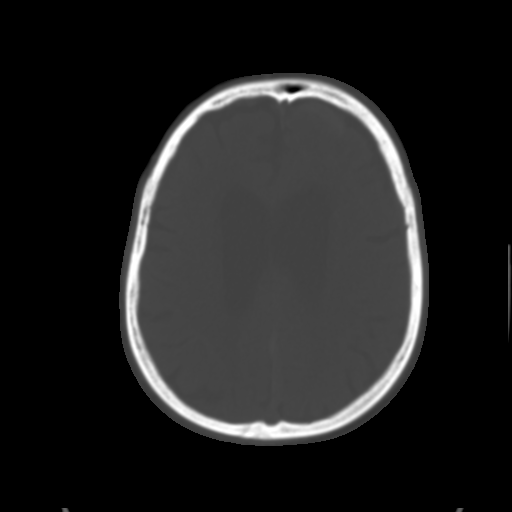
[im 25/33  brain]
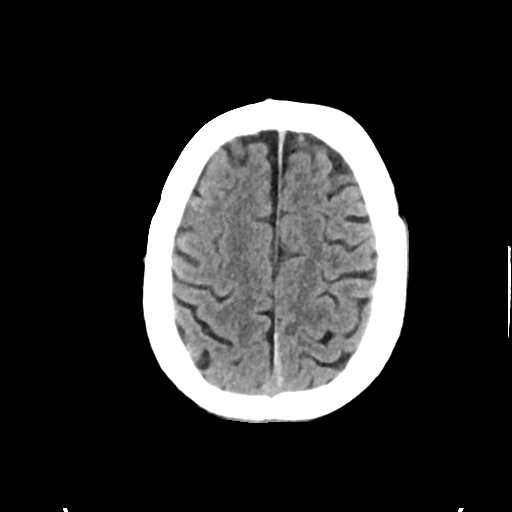
[im 29/33  brain]
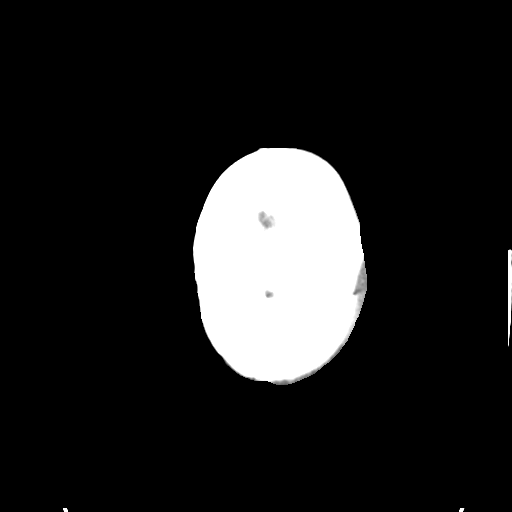

[Series 4: cor soft · coronal · 0.31mm/px · 3 of 67 slices shown]
[im 23/67  brain]
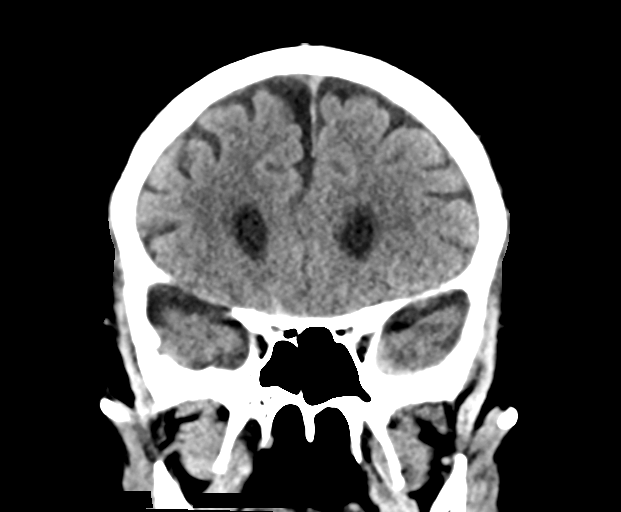
[im 30/67  brain]
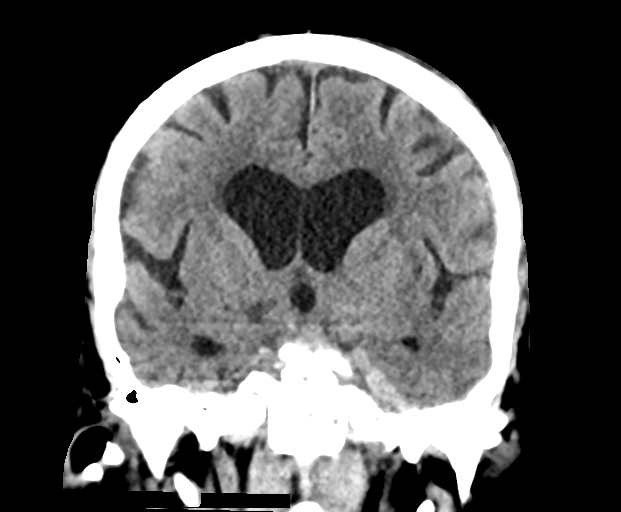
[im 37/67  brain]
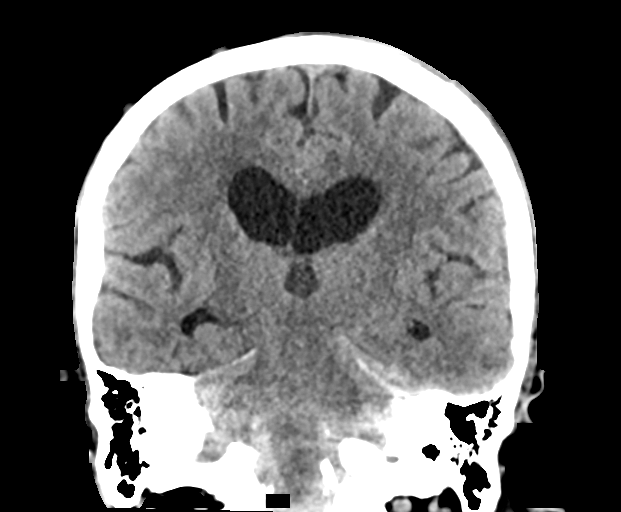

[Series 5: sag soft · sagittal · 0.32mm/px · 3 of 61 slices shown]
[im 21/61  brain]
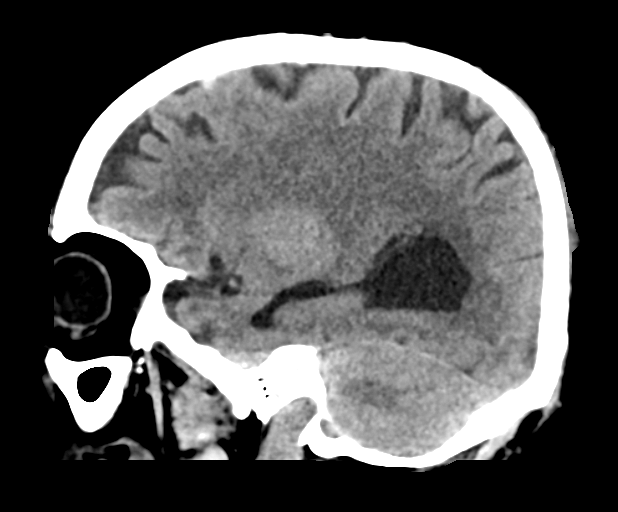
[im 31/61  brain]
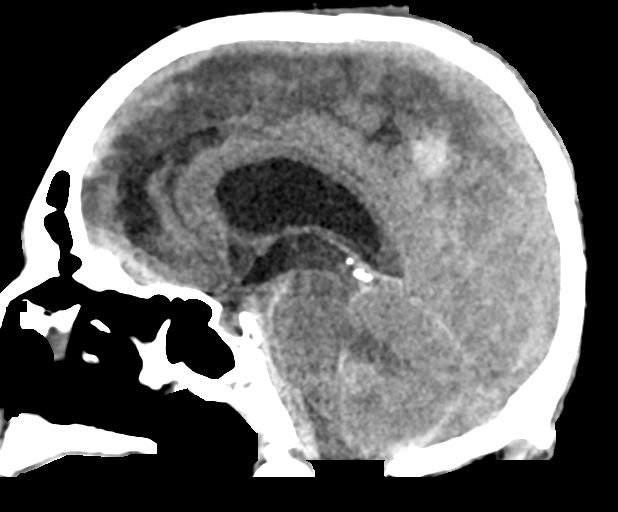
[im 41/61  brain]
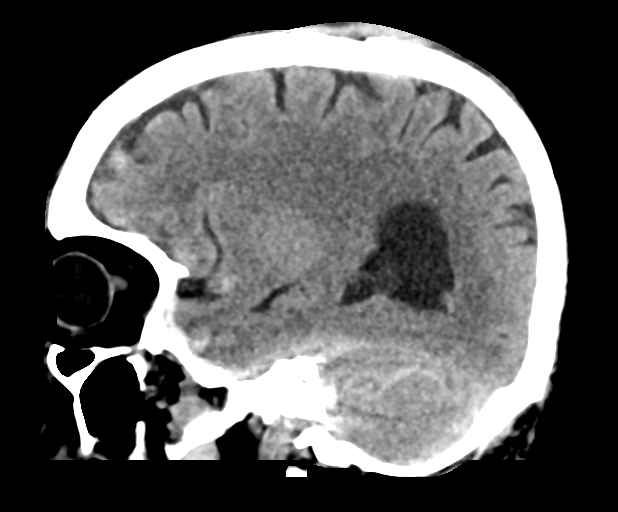

[Series 6: head bone · axial · 0.46mm/px · z∈[+1288,+1304]mm · 2 of 81 slices shown]
[im 9/81  bone]
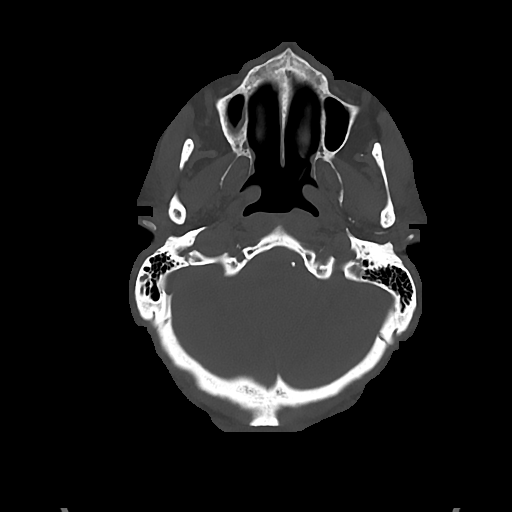
[im 17/81  bone]
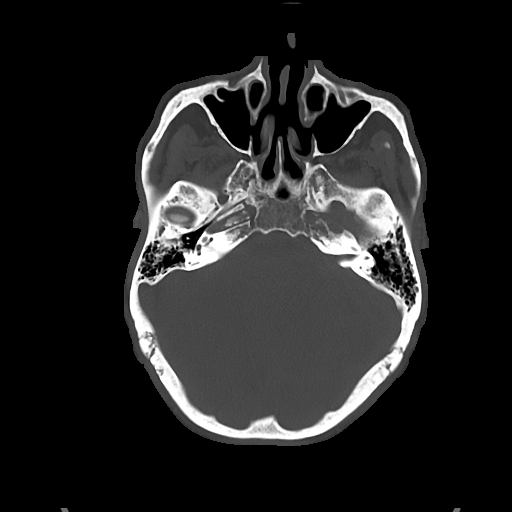

[15 of 47 positions shown; findings below may reference images not displayed]

FINDINGS: Brain: 3 mm subdural hematoma over left parietal convexity. Thin
subdural hematoma left aspect of posterior falx and left tentorium
cerebelli. Moderate volume of subarachnoid hemorrhage throughout
basilar cisterns, prepontine cistern, within cerebellar folia, and
several small foci over the convexities. Small volume of hemorrhage
within fourth ventricle and left occipital horn of lateral
ventricle, likely redistribution. Hypoattenuation with hemorrhage of
left lateral cerebellar hemisphere, probable cortical contusion.

Mass effect with 4 mm left-to-right midline shift. Hemorrhage and
mass effect in the posterior fossa effaces cisterns, fourth
ventricle, and results in slight upward herniation of the midbrain.
No downward herniation at this time.

Lateral and third ventricles are moderately enlarged, no
transependymal interstitial edema at this time.

Vascular: No unexpected calcification.

Skull: Comminuted fracture of the left parietal bone with up to 3 mm
of depression. Fracture lines cross the sagittal suture to right
parietal bone. Large soft tissue contusion over the left parietal
scalp and additional small contusion over the right paramedian
occipital scalp. Possible widening of the left temporal occipital
joint may reflect diastatic fracture.

Sinuses/Orbits: No acute finding.

Other: None.
IMPRESSION: 1. Comminuted left parietal bone fracture with up to 3 mm of
depression. Fracture lines propagating into the right parietal bone.
2. Possible diastatic fracture of left temporal-occipital
syndesmosis.
3. 3 mm subdural hematoma over left parietal convexity, left
posterior falx, and left tentorium cerebelli.
4. Moderate volume subarachnoid hemorrhage predominantly around
basilar cisterns and posterior fossa.
5. Hemorrhage in fourth ventricle and trace hemorrhage in left
atria.
6. Left lateral cerebellar hemisphere probable cortical contusion.
7. Mass effect with 4 mm left-to-right midline shift. Complete
effacement of posterior fossa extra-axial spaces and slight upward
herniation of midbrain. No downward herniation at this time.
8. Moderate lateral and third ventriculomegaly, no prior for
comparison, patient is at high risk for hydrocephalus and follow-up
is recommended.

These results were called by telephone at the time of interpretation
on 01/04/2018 at [DATE] to Dr. ACOSTA TIAN , who verbally
acknowledged these results.

By: Dong-Seob Meckelborg M.D.

## 2018-08-28 IMAGING — CT CT ANGIO HEAD
2 of 8 series · 9 of 47 positions shown · IV contrast (Omni 300)
Comparison: 01/04/2017 CT head

CLINICAL DATA: [AGE]/o M; subarachnoid hemorrhage, evaluation for
cerebral aneurysm for cerebral vasospasm.

EXAM:
CT ANGIOGRAPHY HEAD
TECHNIQUE: Multidetector CT imaging of the head was performed using the
standard protocol during bolus administration of intravenous
contrast. Multiplanar CT image reconstructions and MIPs were
obtained to evaluate the vascular anatomy.
CONTRAST:  50 cc Isovue 370

[Series 5: cow 2.0 · axial · 0.42mm/px · z∈[-137,-11]mm · 6 of 89 slices shown]
[im 13/89  brain]
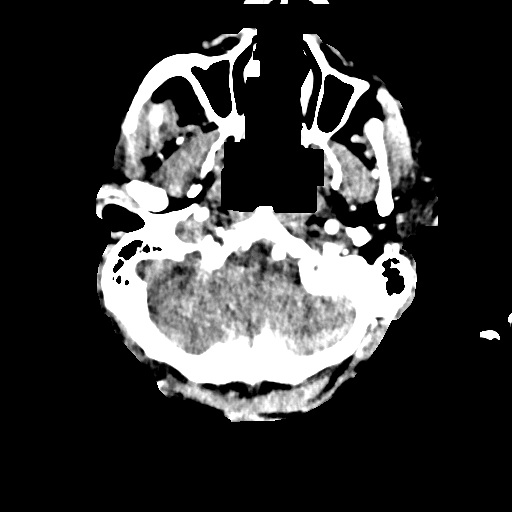
[im 26/89  brain]
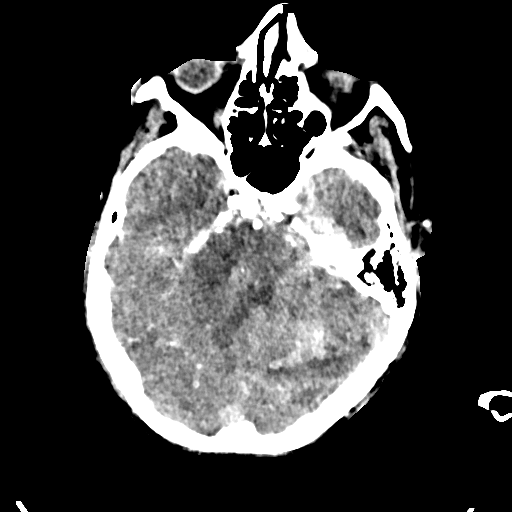
[im 38/89  brain]
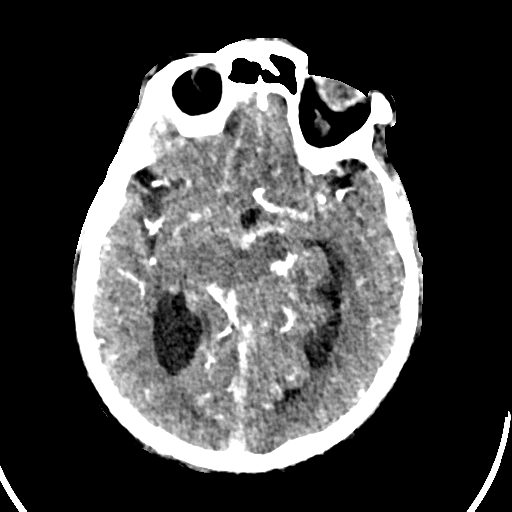
[im 51/89  brain]
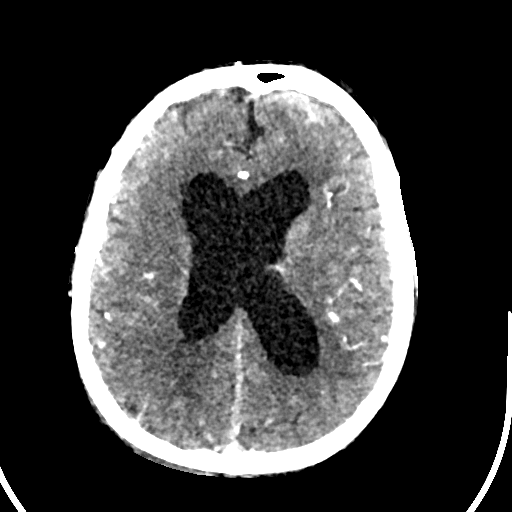
[im 63/89  brain]
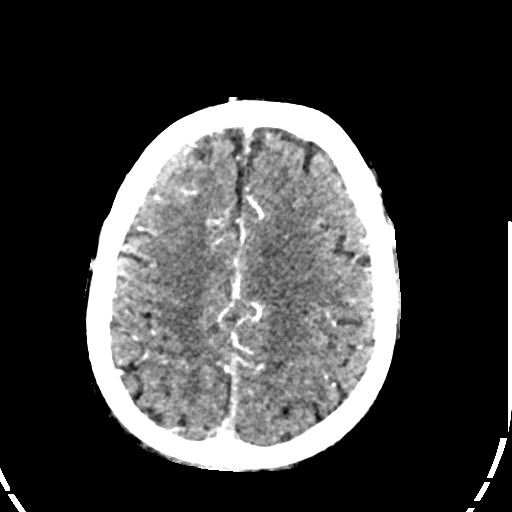
[im 76/89  brain]
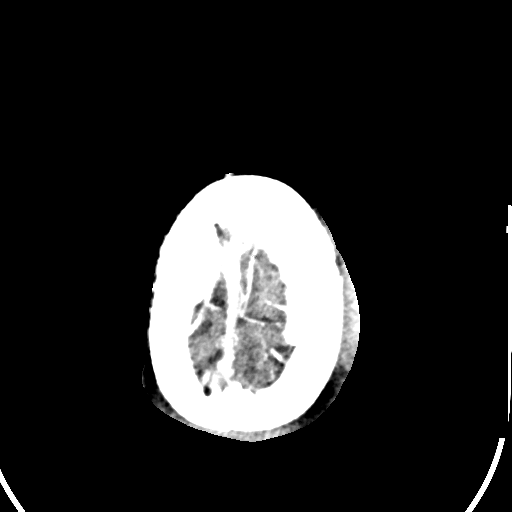

[Series 13: head delay · axial · delayed · 0.41mm/px · z∈[-161,+24]mm · 3 of 38 slices shown]
[im 1/38  brain]
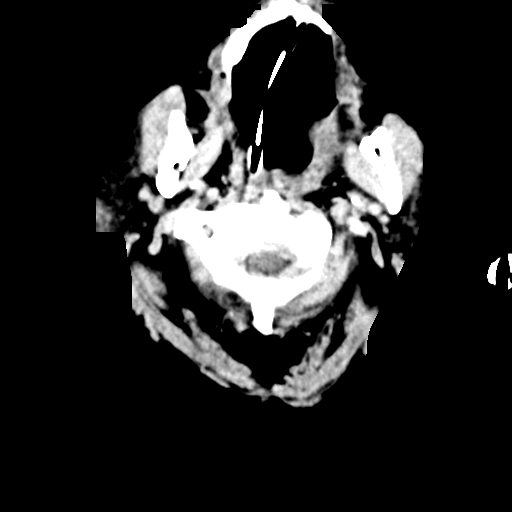
[im 19/38  bone]
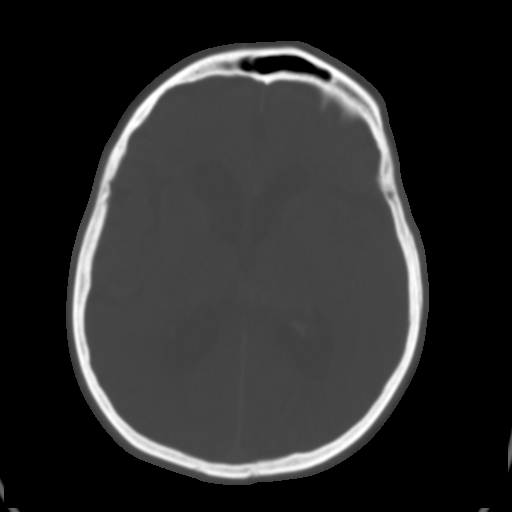
[im 38/38  brain]
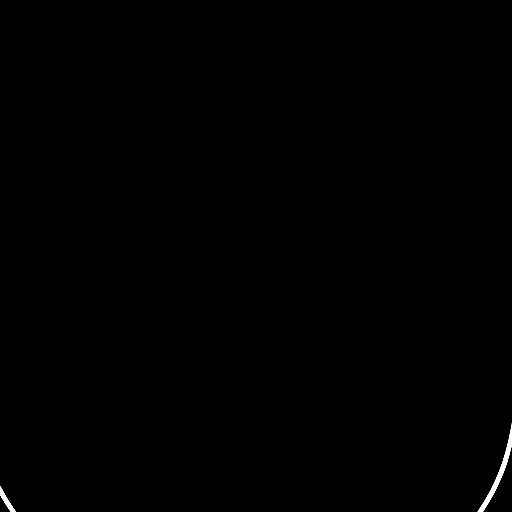

[9 of 47 positions shown; findings below may reference images not displayed]

FINDINGS: CTA HEAD

Anterior circulation: Extensive calcified plaque of carotid siphons
with mild less than 50% bilateral paraclinoid stenosis. Left M1
short segment of mild-to-moderate stenosis. No aneurysm or high flow
vascular lesion identified.

Posterior circulation: Right V4 calcified plaque with 50-70%
moderate to severe stenosis. Left V4 calcified plaque mild less than
50% stenosis. No aneurysm or high flow vascular lesion identified.

Venous sinuses: As permitted by contrast timing, patent.

Anatomic variants: Large left A1, small right A1, anterior
communicating artery, likely normal variant. Left dominant ACA
system.

Delayed phase: Marked interval increase in size of hematoma within
the left cerebellar hemisphere with progressive upward
transtentorial herniation, left-to-right midline shift in posterior
fossa, and compression of the brainstem. The hematoma measures
approximately 4.1 x 6.5 x 2.0 cm (volume = 28 cm^3). On the
delayed phase there is curvilinear enhancement within the hematoma
which may represent active hemorrhage.
IMPRESSION: 1. No aneurysm or high flow vascular lesion identified.
2. Interval progression of large hematoma within the left cerebellar
hemisphere measuring approximately 28 cc. Streak of enhancement on
delayed phase may represent active hemorrhage.
3. Increased posterior fossa mass effect with progressive superior
transtentorial herniation, left-to-right shift of posterior fossa,
and brainstem compression.
4. Patent anterior and posterior intracranial circulation. Multiple
areas of stenosis with calcified plaque compatible with
atherosclerotic disease. No large vessel occlusion.

These results were called by telephone at the time of interpretation
on 01/04/2018 at [DATE] to Dr. Heydi, who verbally acknowledged
these results.

By: Adeel Arauz M.D.
